# Patient Record
Sex: Female | Born: 1950
Health system: Southern US, Community
[De-identification: ages and names within clinical notes are randomized; demographics above are authoritative.]

## PROBLEM LIST (undated history)

## (undated) DIAGNOSIS — F419 Anxiety disorder, unspecified: Secondary | ICD-10-CM

## (undated) DIAGNOSIS — F32A Depression, unspecified: Secondary | ICD-10-CM

## (undated) DIAGNOSIS — M545 Low back pain, unspecified: Secondary | ICD-10-CM

## (undated) DIAGNOSIS — K219 Gastro-esophageal reflux disease without esophagitis: Secondary | ICD-10-CM

## (undated) DIAGNOSIS — F329 Major depressive disorder, single episode, unspecified: Secondary | ICD-10-CM

## (undated) HISTORY — PX: CARPAL TUNNEL RELEASE: SHX101

## (undated) HISTORY — DX: Depression, unspecified: F32.A

## (undated) HISTORY — DX: Gastro-esophageal reflux disease without esophagitis: K21.9

## (undated) HISTORY — PX: CHOLECYSTECTOMY: SHX55

## (undated) HISTORY — DX: Low back pain: M54.5

## (undated) HISTORY — PX: HERNIA REPAIR: SHX51

## (undated) HISTORY — PX: PLANTAR FASCIA RELEASE: SHX2239

## (undated) HISTORY — PX: ABDOMINAL HYSTERECTOMY: SHX81

## (undated) HISTORY — DX: Low back pain, unspecified: M54.50

## (undated) HISTORY — DX: Major depressive disorder, single episode, unspecified: F32.9

## (undated) HISTORY — DX: Anxiety disorder, unspecified: F41.9

---

## 2001-06-06 ENCOUNTER — Encounter: Payer: Self-pay | Admitting: Neurological Surgery

## 2001-06-06 ENCOUNTER — Ambulatory Visit (HOSPITAL_COMMUNITY): Admission: RE | Admit: 2001-06-06 | Discharge: 2001-06-06 | Payer: Self-pay | Admitting: Neurological Surgery

## 2002-02-27 ENCOUNTER — Encounter: Admission: RE | Admit: 2002-02-27 | Discharge: 2002-02-27 | Payer: Self-pay | Admitting: Neurological Surgery

## 2002-02-27 ENCOUNTER — Encounter: Payer: Self-pay | Admitting: Neurological Surgery

## 2002-07-21 ENCOUNTER — Encounter
Admission: RE | Admit: 2002-07-21 | Discharge: 2002-10-19 | Payer: Self-pay | Admitting: Physical Medicine & Rehabilitation

## 2003-11-03 ENCOUNTER — Ambulatory Visit: Payer: Self-pay | Admitting: Psychiatry

## 2004-02-02 ENCOUNTER — Ambulatory Visit: Payer: Self-pay | Admitting: Psychiatry

## 2004-04-28 ENCOUNTER — Ambulatory Visit: Payer: Self-pay | Admitting: Psychiatry

## 2004-05-31 ENCOUNTER — Ambulatory Visit: Payer: Self-pay | Admitting: Psychiatry

## 2004-08-30 ENCOUNTER — Ambulatory Visit: Payer: Self-pay | Admitting: Psychiatry

## 2004-11-03 ENCOUNTER — Ambulatory Visit: Payer: Self-pay | Admitting: Psychiatry

## 2005-01-26 ENCOUNTER — Ambulatory Visit: Payer: Self-pay | Admitting: Psychiatry

## 2005-04-04 ENCOUNTER — Ambulatory Visit (HOSPITAL_COMMUNITY): Payer: Self-pay | Admitting: Psychiatry

## 2005-06-29 ENCOUNTER — Ambulatory Visit (HOSPITAL_COMMUNITY): Payer: Self-pay | Admitting: Psychiatry

## 2005-10-12 ENCOUNTER — Ambulatory Visit (HOSPITAL_COMMUNITY): Payer: Self-pay | Admitting: Psychiatry

## 2006-01-02 ENCOUNTER — Ambulatory Visit (HOSPITAL_COMMUNITY): Payer: Self-pay | Admitting: Psychiatry

## 2006-01-23 HISTORY — PX: COLONOSCOPY: SHX174

## 2006-03-06 ENCOUNTER — Ambulatory Visit (HOSPITAL_COMMUNITY): Payer: Self-pay | Admitting: Psychiatry

## 2006-08-16 ENCOUNTER — Ambulatory Visit (HOSPITAL_COMMUNITY): Payer: Self-pay | Admitting: Psychiatry

## 2006-10-16 ENCOUNTER — Ambulatory Visit (HOSPITAL_COMMUNITY): Payer: Self-pay | Admitting: Psychiatry

## 2006-10-31 ENCOUNTER — Ambulatory Visit: Payer: Self-pay | Admitting: Gastroenterology

## 2006-11-05 ENCOUNTER — Ambulatory Visit (HOSPITAL_COMMUNITY): Admission: RE | Admit: 2006-11-05 | Discharge: 2006-11-05 | Payer: Self-pay | Admitting: Gastroenterology

## 2006-11-05 ENCOUNTER — Ambulatory Visit: Payer: Self-pay | Admitting: Gastroenterology

## 2006-12-13 ENCOUNTER — Ambulatory Visit (HOSPITAL_COMMUNITY): Payer: Self-pay | Admitting: Psychiatry

## 2007-02-27 ENCOUNTER — Ambulatory Visit: Payer: Self-pay | Admitting: Gastroenterology

## 2007-05-21 ENCOUNTER — Ambulatory Visit (HOSPITAL_COMMUNITY): Payer: Self-pay | Admitting: Psychiatry

## 2007-08-15 ENCOUNTER — Ambulatory Visit (HOSPITAL_COMMUNITY): Payer: Self-pay | Admitting: Psychiatry

## 2008-01-30 ENCOUNTER — Ambulatory Visit (HOSPITAL_COMMUNITY): Payer: Self-pay | Admitting: Psychiatry

## 2008-03-26 ENCOUNTER — Ambulatory Visit (HOSPITAL_COMMUNITY): Payer: Self-pay | Admitting: Psychiatry

## 2008-04-08 ENCOUNTER — Ambulatory Visit (HOSPITAL_COMMUNITY): Admission: RE | Admit: 2008-04-08 | Discharge: 2008-04-08 | Payer: Self-pay | Admitting: Ophthalmology

## 2008-04-22 ENCOUNTER — Ambulatory Visit (HOSPITAL_COMMUNITY): Admission: RE | Admit: 2008-04-22 | Discharge: 2008-04-22 | Payer: Self-pay | Admitting: Ophthalmology

## 2008-07-16 ENCOUNTER — Ambulatory Visit (HOSPITAL_COMMUNITY): Payer: Self-pay | Admitting: Psychiatry

## 2008-12-22 ENCOUNTER — Ambulatory Visit (HOSPITAL_COMMUNITY): Payer: Self-pay | Admitting: Psychiatry

## 2009-05-04 ENCOUNTER — Ambulatory Visit (HOSPITAL_COMMUNITY): Payer: Self-pay | Admitting: Psychiatry

## 2009-07-01 ENCOUNTER — Ambulatory Visit (HOSPITAL_COMMUNITY): Payer: Self-pay | Admitting: Psychiatry

## 2009-11-11 ENCOUNTER — Ambulatory Visit (HOSPITAL_COMMUNITY): Payer: Self-pay | Admitting: Psychiatry

## 2010-01-11 ENCOUNTER — Ambulatory Visit (HOSPITAL_COMMUNITY): Payer: Self-pay | Admitting: Psychiatry

## 2010-03-08 ENCOUNTER — Encounter (HOSPITAL_COMMUNITY): Payer: Self-pay | Admitting: Psychiatry

## 2010-03-08 ENCOUNTER — Encounter (INDEPENDENT_AMBULATORY_CARE_PROVIDER_SITE_OTHER): Payer: Medicaid Other | Admitting: Psychiatry

## 2010-03-08 DIAGNOSIS — F331 Major depressive disorder, recurrent, moderate: Secondary | ICD-10-CM

## 2010-05-05 ENCOUNTER — Encounter (INDEPENDENT_AMBULATORY_CARE_PROVIDER_SITE_OTHER): Payer: Medicaid Other | Admitting: Psychiatry

## 2010-05-05 DIAGNOSIS — F331 Major depressive disorder, recurrent, moderate: Secondary | ICD-10-CM

## 2010-05-05 LAB — BASIC METABOLIC PANEL
BUN: 16 mg/dL (ref 6–23)
CO2: 28 mEq/L (ref 19–32)
Calcium: 9.5 mg/dL (ref 8.4–10.5)
Chloride: 98 mEq/L (ref 96–112)
Creatinine, Ser: 0.7 mg/dL (ref 0.4–1.2)
GFR calc Af Amer: >60 mL/min (ref >60)
GFR calc non Af Amer: >60 mL/min (ref >60)
Glucose, Bld: 108 mg/dL — ABNORMAL HIGH (ref 70–99)
Potassium: 3.6 mEq/L (ref 3.5–5.1)
Sodium: 138 mEq/L (ref 135–145)

## 2010-05-05 LAB — HEMOGLOBIN AND HEMATOCRIT, BLOOD
HCT: 36 % (ref 36.0–46.0)
Hemoglobin: 12.7 g/dL (ref 12.0–15.0)

## 2010-06-07 NOTE — Consult Note (Signed)
[name], [name]                ACCOUNT NO.:  [id number]   MEDICAL RECORD NO.:  [id number]         PATIENT TYPE:  AMB   LOCATION:  DAY                           FACILITY:  [facility]   PHYSICIAN:  [name], M.D.      DATE OF BIRTH:  September 15, 1950   DATE OF CONSULTATION:  10/31/2006  DATE OF DISCHARGE:                                 CONSULTATION   DATE OF CONSULTATION:  October 31, 2006   REASON FOR CONSULTATION:  Hematochezia.   HISTORY OF PRESENT ILLNESS:  [name] is a pleasant, 59 year old lady  who presents today for further evaluation of hematochezia, at the  request of Dr. Clelia Croft.  She had an episode of bright red blood per rectum  which occurred over several occasions in a 24-hour period of time (a  couple of weeks ago).  She noted that the bleeding when she wiped with  tissue.  She denies any constipation.  Her bowel movements are very  regular.  Denies any melena, abdominal pain, rectal pain, nausea or  vomiting.  She has occasional heart burn for which she takes Prilosec.  Denies dysphagia, odynophagia, or unintentional weight loss.  She has  never had a colonoscopy.   CURRENT MEDICATIONS:  1. Valium 10 mg t.i.d.  2. Hyzaar 50 mg daily.  3. Lexapro 20 mg daily.  4. Phenergan 25 mg p.r.n.  5. Prilosec p.r.n.   ALLERGIES:  No known drug allergies.   PAST MEDICAL HISTORY:  1. Hypertension.  2. Degenerative disk disease.  3. Anxiety/depression.   PAST SURGICAL HISTORY:  Cholecystectomy with hernia repair, bilateral  carpal tunnel release, bone spurs removed from both feet, Cesarean  section, hysterectomy and repeat abdominal hernia repair.   FAMILY HISTORY:  Father died in his late 82's due to brain tumor.  A  cousin had colorectal cancer but no first degree relatives.   SOCIAL HISTORY:  She is married, has 2 children and 2 step-children. She  has never smoked cigarettes or used other tobacco products.  Denies any  alcohol use.  She is currently unemployed.   REVIEW OF SYSTEMS:  See HPI for GI and constitutional.  Cardiopulmonary  - denies chest pain, shortness of breath, cough or palpitations.  Genitourinary - denies dysuria, hematuria.   PHYSICAL EXAMINATION:  VITAL SIGNS:  Weight 199, height 5 feet, 5  inches. Temperature 98, blood pressure 118/80, pulse 80.  GENERAL:  Pleasant, obese Caucasian female in no acute distress.  SKIN:  Warm and dry, no jaundice.  HEENT:  Sclerae anicteric. Oropharyngeal mucosa moist and pink, no  lesions, erythema or exudates.  NECK:  No lymphadenopathy, thyromegaly.  CHEST:  Lungs were clear to auscultation.  CARDIAC EXAM:  Reveals a regular rate and rhythm, normal S1, S2, no  murmurs, rubs, or gallops.  ABDOMEN:  Soft, obese but symmetrical, nontender, no organomegaly or  masses, no rebound tenderness or guarding. No abdominal bruits or  guarding, no abdominal bruits or hernias, positive bowel sounds.  EXTREMITIES:  No edema.   IMPRESSION:  [name] is a 60 year old lady with recent episode of  hematochezia.  She has never had a screening colonoscopy. She may have  bleeding from a benign anorectal source however, colorectal polyps,  carcinoma need to be excluded.   PLAN:  Colonoscopy with Dr. Cira Servant in the near future.   I would like to thank Dr. Kirstie Peri for allowing [place] to provide in the  care of this patient.      Tana Coast, P.A.      [name], M.D.  Electronically Signed    LL/MEDQ  D:  10/31/2006  T:  11/01/2006  Job:  [id number]   cc:   Eduard Clos, M.D.  [address]  [place], [place]. [address]

## 2010-06-07 NOTE — Assessment & Plan Note (Signed)
[name], [name]                 CHART#:  [id number]   DATE:  02/27/2007                       DOB:  [birth year]   CHIEF COMPLAINT:  Loose bowel movements.   SUBJECTIVE:  The patient is a 60 year old Caucasian female who presents  for a follow up of loose bowel movements.  We saw her back on October 31, 2006 for hematochezia.  At the time she simply stated that her stools  are very regular ever since she had her gallbladder out around 2000.  She really did not complain of any diarrhea.  She was having a couple-  week history of hematochezia.  She underwent a colonoscopy by Dr. Cira Servant  which revealed sigmoid colon diverticulosis, but otherwise unremarkable.  It was felt that she may have had rectal bleeding from a diverticular  source.  She states that she continues to have loose bowel movements.  She called Dr. Jena Gauss over the weekend and was complaining of the same  and an appointment was made.  He also ordered stool studies which were  all negative including O&P, lactoferrin, C-diff.  A stool culture  revealed moderate yeast but no other organisms.  She says her stools  have not really changed.  Ever since her gallbladder is out she has  episodes of post prandial loose stools.  She may have 3 or 4 a day, but  some days may only have 1 or even none.  She never gets constipated.  Her stools are often very soft or loose.  Denies any blood in the stool  or nocturnal diarrhea.  She has crampy abdominal pain relieved with  defecation.  She tried Bentyl and Levsin with no results.  She avoids  seeds.  She states she consumes a high-fiber diet, but it does not  appear so based on the 24-hour recall.  She has never been on a fiber  supplement, although one was recommended at the time of her colonoscopy.   CURRENT MEDICATIONS:  See updated list.   ALLERGIES:  No known drug allergies.   PHYSICAL EXAMINATION:  Weight 200, stable.  Temp 98, blood pressure  102/70, pulse 56.  GENERAL:  A  pleasant, obese Caucasian female in no acute distress.  SKIN:  Warm and dry, no jaundice.  HEENT:  Sclerae anicteric.  Oropharyngeal mucosa moist and pink.  No  lesions, erythema, or exudate.  ABDOMEN:  Positive bowel sounds, soft, nontender, nondistended.  No  organomegaly or masses, no rebound or guarding.  LOWER EXTREMITIES:  No edema.   IMPRESSION:  The patient is a 60 year old lady who is here for a  followup of loose bowel movement.  She has had loose stools ever since  her gallbladder was removed in 2000.  Nothing has really drastically  changed over the recent months.  She may have post cholecystectomy  diarrhea and/or irritable bowel syndrome.  I do not suspect that  moderate yeast on stool culture to be significant at this time.   PLAN:  1. Will give a trial of Questran 2 g daily, not to be taken within 2      hours of other medications, a 30-day supply, zero refills.  2. High-fiber diet.  Sheet handout to patient.  If she is not      consuming 25 g of fiber daily, she should  take a supplement such as      Benefiber or Fiber Choice.  3. She will call in 2 weeks with a progress report.  If not better at      that time, could consider treating for moderate yeast in the stool.      Further recommendations to follow.       Tana Coast, P.A.  Electronically Signed     R. Roetta Sessions, M.D.  Electronically Signed    LL/MEDQ  D:  02/27/2007  T:  02/28/2007  Job:  [id number]   cc:   Sherryll Burger, MD

## 2010-06-07 NOTE — Op Note (Signed)
[name], [name]                ACCOUNT NO.:  [id number]   MEDICAL RECORD NO.:  [id number]          PATIENT TYPE:  AMB   LOCATION:  DAY                           FACILITY:  [facility]   PHYSICIAN:  Kassie Mends, M.D.      DATE OF BIRTH:  06-29-1950   DATE OF PROCEDURE:  11/05/2006  DATE OF DISCHARGE:                               OPERATIVE REPORT   REFERRING PHYSICIAN:  Kirstie Peri, M.D.   PROCEDURE:  Colonoscopy.   INDICATIONS FOR PROCEDURE:  The patient is a 60 year old female who  presents with rectal bleeding.  She has never had a colonoscopy.   FINDINGS:  1. Sigmoid colon diverticulosis.  2. Otherwise tortuous colon without evidence of polyps, masses,      inflammatory changes, or AVM's.  3. Normal retroflexed view of the rectum.   DIAGNOSIS:  No source for the patient's rectal bleeding was identified.  She likely had a diverticular bleed.   RECOMMENDATIONS:  1. Colonoscopy in 10 years.  2. She should follow a high fiber diet.  She is given a handout on      high fiber diet and diverticulosis.   MEDICATIONS:  1. Demerol 125 mg IV.  2. Versed 9 mg IV.  3. Phenergan 25 mg IV.   DESCRIPTION OF PROCEDURE:  Physical examination was performed and  informed consent was obtained from the patient after explaining the  benefits, risks, and alternatives to the procedure.  The patient was  connected to the monitor and placed in the left lateral position.  Continuous oxygen was provided by nasal cannula and IV medicine  administered through an indwelling cannula.  After administration of  sedation and rectal examination, the patient's  rectum was entered by endoscope and was advanced under direct  visualization to the cecum.  The scope was removed slowly by carefully  examining the color, texture, anatomy, and integrity of the mucosa on  the way out.  The patient was recovered in endoscopy and discharged home  in satisfactory condition.      Kassie Mends, M.D.  Electronically  Signed     SM/MEDQ  D:  11/05/2006  T:  11/05/2006  Job:  [id number]   cc:   Kirstie Peri, MD  Fax: [phone]

## 2010-06-10 NOTE — Op Note (Signed)
[place]  Patient:    [name], [name] Visit Number: [id number] MRN: [id number]          Service Type: DSU Location: [facility] 28[address] [id number] Attending Physician:  Jonne Ply Dictated by:   Stefani Dama, M.D. Proc. Date: 06/06/01 Admit Date:  06/06/2001 Discharge Date: 06/06/2001                             Operative Report  PREOPERATIVE DIAGNOSIS:  Left carpal tunnel syndrome.  POSTOPERATIVE DIAGNOSIS:  Left carpal tunnel syndrome.  OPERATION PERFORMED:  Left carpal tunnel release.  SURGEON:  Stefani Dama, M.D.  ANESTHESIA:  Local plus MAC sedation.  INDICATIONS FOR PROCEDURE:  The patient is a 60 year old individual who has had pain, numbness and tingling into the left arm and hand. She had EMG studies that confirmed significant carpal tunnel syndrome on that left side. She has failed splinting, anti-inflammatories and has had progressive symptoms and was advised regarding carpal tunnel surgery.  DESCRIPTION OF PROCEDURE:  The patient was brought to the operating room supine on the operating table.  The left arm and hand was prepped with DuraPrep while an IV was started on the right side.  She was given IV sedation with Versed and Diprivan and then the region of the wrist was marked from the center crease to a line following the fourth ray on the left hand.  The area was infiltrated with 1% lidocaine with epinephrine 1:100,000.  A vertical incision was made from the crease of the wrist distally towards the palmar arch.  The patient had a significant amount of subcutaneous fatty tissue. This was divided. The transcarpal ligament was identified and this was opened with a 15 blade.  Then using a Penfield 4 as a guard, the length of the transcarpal ligament was opened revealing a flattened and compressed median nerve underneath this.  The ligament was checked to make sure that it was opened as far proximally as possible and distally  into the palmar arcade. Once this was well released, hemostasis in the soft tissues was obtained.  The skin was closed with 3-0 nylon and a vertical mattress sutures being placed. A fluffy dressing was placed over this incision and the wrist was wrapped with an Ace bandage.  The patient was transferred to the recovery room in stable condition. Dictated by:   Stefani Dama, M.D. Attending Physician:  Jonne Ply DD:  06/06/01 TD:  06/10/01 Job: [id number]

## 2010-07-05 ENCOUNTER — Encounter (HOSPITAL_COMMUNITY): Payer: Medicaid Other | Admitting: Psychiatry

## 2010-07-14 ENCOUNTER — Encounter (HOSPITAL_COMMUNITY): Payer: Medicaid Other | Admitting: Psychiatry

## 2010-08-16 ENCOUNTER — Encounter (INDEPENDENT_AMBULATORY_CARE_PROVIDER_SITE_OTHER): Payer: Medicaid Other | Admitting: Psychiatry

## 2010-08-16 DIAGNOSIS — F331 Major depressive disorder, recurrent, moderate: Secondary | ICD-10-CM

## 2010-10-11 ENCOUNTER — Encounter (HOSPITAL_COMMUNITY): Payer: Medicaid Other | Admitting: Psychiatry

## 2010-11-09 ENCOUNTER — Encounter: Payer: Self-pay | Admitting: *Deleted

## 2010-11-10 ENCOUNTER — Encounter (INDEPENDENT_AMBULATORY_CARE_PROVIDER_SITE_OTHER): Payer: Medicaid Other | Admitting: Psychiatry

## 2010-11-10 DIAGNOSIS — F331 Major depressive disorder, recurrent, moderate: Secondary | ICD-10-CM

## 2010-11-11 ENCOUNTER — Telehealth: Payer: Self-pay | Admitting: *Deleted

## 2010-11-11 ENCOUNTER — Encounter: Payer: Self-pay | Admitting: Cardiology

## 2010-11-11 ENCOUNTER — Ambulatory Visit (INDEPENDENT_AMBULATORY_CARE_PROVIDER_SITE_OTHER): Payer: Medicaid Other | Admitting: Cardiology

## 2010-11-11 ENCOUNTER — Other Ambulatory Visit: Payer: Self-pay | Admitting: Cardiology

## 2010-11-11 DIAGNOSIS — Z79899 Other long term (current) drug therapy: Secondary | ICD-10-CM

## 2010-11-11 DIAGNOSIS — F329 Major depressive disorder, single episode, unspecified: Secondary | ICD-10-CM

## 2010-11-11 DIAGNOSIS — I10 Essential (primary) hypertension: Secondary | ICD-10-CM | POA: Insufficient documentation

## 2010-11-11 DIAGNOSIS — R079 Chest pain, unspecified: Secondary | ICD-10-CM | POA: Insufficient documentation

## 2010-11-11 DIAGNOSIS — F341 Dysthymic disorder: Secondary | ICD-10-CM

## 2010-11-11 DIAGNOSIS — R002 Palpitations: Secondary | ICD-10-CM

## 2010-11-11 DIAGNOSIS — Z136 Encounter for screening for cardiovascular disorders: Secondary | ICD-10-CM | POA: Insufficient documentation

## 2010-11-11 NOTE — Assessment & Plan Note (Signed)
Blood pressure is now well controlled. Patient has normal LV function and no evidence of significant LVH either by echocardiogram or EKG.

## 2010-11-11 NOTE — Telephone Encounter (Signed)
Pt has Medicaid only, no precert required. °

## 2010-11-11 NOTE — Progress Notes (Signed)
History of present illness:  The patient is a 60 year old female with no prior history of diabetes mellitus, no tobacco use but exposed to second hand smoking and unknown lipid panels. She is accompanied by her husband who was never checked out because of atypical chest pain. Her husband is also my patient with significant coronary vascular disease. The patient state that she has lost a significant amount of weight and has come down from 205 pounds to 125 pounds. She used to eat a lot of sweets and did no exercise. She does work on lifestyle changes but also was placed on phentermine for approximately a year. She now reports over the last several months left-sided chest pain which occurs both at rest and on walking. In general when it occurs when walking it gets better when she stops walking. There is no radiation of the pain. There is no heavy pressure sensation and more of a sharp pain at the left sternal border. There is no associated diaphoresis or shortness of breath. The patient has had no prior blood testing for dyslipidemia according to her. She also has a history of anxiety and depression and is under treatment.   Allergies, family history and social history: As documented in chart and reviewed. Known drug allergies. Family history of lung cancer brain tumor and heart disease in her sister. Also father had heart disease a later age but he was a smoker. 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  Medications: Documented and reviewed in chart. Medications include diazepam Lexapro and hydrocodone  Past medical history: Reviewed and see problem list below. History of C-section, hysterectomy hernia repair and cholecystectomy   Review of systems: No nausea and vomiting no fever or chills no melena or hematochezia. No dysuria or frequency. No visual changes. No claudication symptoms   Physical examination : Vital signs documented  below General: Well-nourished white female pale appearing but in no distress HEENT: EOMI, PERRLA, normal carotid upstroke no carotid bruit. No thyromegaly Lungs: Clear breath sounds bilaterally without any wheezing Heart: Regular rate and rhythm with normal S1-S2 and no murmur rubs or gallops Abdomen: Soft nontender without rebound or guarding good bowel sounds Extremity exam: No cyanosis clubbing or edema Neurologic: Alert and oriented and grossly nonfocal. Psychiatric: Normal affect Vascular exam: Normal peripheral pulses.  12-lead echocardiogram normal sinus rhythm and no acute changes or prior infarct pattern Limited bedside echocardiogram: Normal LV size and contraction, normal left atrium. Normal right-sided heart structures. Grossly normal valves

## 2010-11-11 NOTE — Assessment & Plan Note (Signed)
Patient's chest pain is atypical but she does have a family history of coronary artery disease and his been exposed for a long time to second hand smoking. We will schedule her for her a stress echocardiogram.

## 2010-11-11 NOTE — Telephone Encounter (Signed)
STRESS ECHO set for 11-15-2010  [facility] Checking percert

## 2010-11-11 NOTE — Assessment & Plan Note (Signed)
We'll obtain a lipid panel.

## 2010-11-11 NOTE — Patient Instructions (Signed)
   Stress Echo  Labs:  CBC, CMET, FLP, & TSH If the results of your test are normal or stable, you will receive a letter.  If they are abnormal, the nurse will contact you by phone. Your physician wants you to follow up in:  3 months.  You will receive a reminder letter in the mail one-two months in advance.  If you don't receive a letter, please call our office to schedule the follow up appointment

## 2010-11-15 DIAGNOSIS — R079 Chest pain, unspecified: Secondary | ICD-10-CM

## 2010-12-31 ENCOUNTER — Other Ambulatory Visit (HOSPITAL_COMMUNITY): Payer: Self-pay

## 2011-02-02 ENCOUNTER — Ambulatory Visit (HOSPITAL_COMMUNITY): Payer: Medicaid Other | Admitting: Psychiatry

## 2011-02-02 ENCOUNTER — Encounter (HOSPITAL_COMMUNITY): Payer: Medicaid Other | Admitting: Psychiatry

## 2011-02-23 ENCOUNTER — Ambulatory Visit: Payer: Medicaid Other | Admitting: Cardiology

## 2011-02-28 ENCOUNTER — Ambulatory Visit (INDEPENDENT_AMBULATORY_CARE_PROVIDER_SITE_OTHER): Payer: Medicaid Other | Admitting: Psychiatry

## 2011-02-28 ENCOUNTER — Encounter (HOSPITAL_COMMUNITY): Payer: Self-pay | Admitting: Psychiatry

## 2011-02-28 VITALS — Wt 124.0 lb

## 2011-02-28 DIAGNOSIS — F411 Generalized anxiety disorder: Secondary | ICD-10-CM

## 2011-02-28 DIAGNOSIS — F419 Anxiety disorder, unspecified: Secondary | ICD-10-CM

## 2011-02-28 DIAGNOSIS — F329 Major depressive disorder, single episode, unspecified: Secondary | ICD-10-CM

## 2011-02-28 MED ORDER — DIAZEPAM 10 MG PO TABS: 10.0000 mg | ORAL_TABLET | Freq: Three times a day (TID) | ORAL | Status: DC

## 2011-02-28 MED ORDER — ESCITALOPRAM OXALATE 20 MG PO TABS: 20.0000 mg | ORAL_TABLET | Freq: Every day | ORAL | Status: DC

## 2011-02-28 NOTE — Progress Notes (Signed)
Patient came for her followup appointment. She is a long history of depression anxiety. She's been seeing in this office since 2004. She's been compliant with her medication Lexapro and Valium. She denies any side effects of medication. She don't use more Valium then it has been prescribed. She denies any drinking or using any drugs. Recently she's been under stress has she is forced to move out to apartment. Patient left her daughter to stay at her place however later find out that place has been infested by bed bug. She is very upset on her daughter has treatment of bed bug costing more than $1500. She could not afford and now she is moving to apartment. She is not talking to her daughter and told her that she should not call her. Overall she's been stable on Lexapro. She is sleeping fine. She denies any crying spells or any agitation. Recently she has no severe anxiety or panic attack. Her primary care Dr. is Dr. Sherryll Burger who has done recently blood work. Her lab including BUN creatinine is within normal limits. She is keeping her weight unchanged. She's been very careful about her diet.  Mental status examination The patient is anxious but cooperative. Her speech is soft clear and coherent. She described her mood is upset and her affect is mood congruent. She denies any active or passive suicidal thoughts or homicidal thoughts. She denies any auditory or visual hallucination. There no psychotic symptoms present. Her thought process logical linear and goal-directed. She's alert and oriented x3. Her insight judgment impulse control is okay.  Assessment Axis I Maj. Depressive disorder, anxiety disorder NOS Axis II chronic back pain Axis III chronic back pain Axis IV moderate  Plan I will continue her Lexapro 20 mg daily and Valium 10 mg 3 times a day. I have explained risks and benefits of medication in detail. I recommended to call if she has any question or concern about the medication. I will see her  again in 3 months. Time spent 15 minutes

## 2011-04-12 ENCOUNTER — Telehealth (HOSPITAL_COMMUNITY): Payer: Self-pay | Admitting: *Deleted

## 2011-04-12 ENCOUNTER — Other Ambulatory Visit (HOSPITAL_COMMUNITY): Payer: Self-pay | Admitting: Psychiatry

## 2011-04-17 NOTE — Telephone Encounter (Signed)
She will require valium 90 tab per month

## 2011-04-25 ENCOUNTER — Ambulatory Visit: Payer: Medicaid Other | Admitting: Cardiology

## 2011-05-02 ENCOUNTER — Other Ambulatory Visit (HOSPITAL_COMMUNITY): Payer: Self-pay | Admitting: Psychiatry

## 2011-05-02 ENCOUNTER — Telehealth (HOSPITAL_COMMUNITY): Payer: Self-pay | Admitting: *Deleted

## 2011-05-02 DIAGNOSIS — F419 Anxiety disorder, unspecified: Secondary | ICD-10-CM

## 2011-05-02 MED ORDER — DIAZEPAM 10 MG PO TABS: 10.0000 mg | ORAL_TABLET | Freq: Three times a day (TID) | ORAL | Status: DC

## 2011-05-02 NOTE — Telephone Encounter (Signed)
Pharmacy called for 90 valium with no refill

## 2011-05-30 ENCOUNTER — Encounter (HOSPITAL_COMMUNITY): Payer: Self-pay | Admitting: Psychiatry

## 2011-05-30 ENCOUNTER — Ambulatory Visit (INDEPENDENT_AMBULATORY_CARE_PROVIDER_SITE_OTHER): Payer: Medicaid Other | Admitting: Psychiatry

## 2011-05-30 VITALS — Wt 125.0 lb

## 2011-05-30 DIAGNOSIS — F329 Major depressive disorder, single episode, unspecified: Secondary | ICD-10-CM

## 2011-05-30 DIAGNOSIS — F419 Anxiety disorder, unspecified: Secondary | ICD-10-CM

## 2011-05-30 DIAGNOSIS — F411 Generalized anxiety disorder: Secondary | ICD-10-CM

## 2011-05-30 MED ORDER — DIAZEPAM 10 MG PO TABS: 10.0000 mg | ORAL_TABLET | Freq: Three times a day (TID) | ORAL | Status: DC

## 2011-05-30 MED ORDER — ESCITALOPRAM OXALATE 20 MG PO TABS: 20.0000 mg | ORAL_TABLET | Freq: Every day | ORAL | Status: DC

## 2011-05-30 NOTE — Progress Notes (Signed)
Chief complaint Medication management and followup.  History of present illness Patient is 61 year old unemployed married female who came for her followup appointment.  She's been compliant with her psychiatric medication.  She continued to endorse significant stress from her husband.  Patient told that she cannot go anywhere by herself.  She likes to visit her 34-year-old granddaughter but cannot go by herself.  Today patient's husband came with her for this appointment.  Overall she likes her current medication.  She denies any agitation anger or mood swings.  She sleeps better with the medication.  There has been no recent panic attack.  She has chronic stress about finances and family issues.  She has any drinking or using any illegal substance.  She does not ask for early refills of her Valium.  Patient is scheduled to see her primary care physician next month.  She start losing her weight and she is relieved by that.  Current psychiatric medication Lexapro 20 mg daily Valium 10 mg 3 times a day  Past psychiatric history She is a long history of depression anxiety. She's been seeing in this office since 2004.  She had tried Klonopin, Xanax, Wellbutrin and Seroquel.  Patient denies any history of suicidal attempt.  Medical history Patient has history of low back pain and acid reflux.  She see Dr. Sherryll Burger and is scheduled to have work next month.  Psychosocial history Patient is living with her husband.  Mental status examination The patient is casually dressed and fairly groomed.  She is nxious but cooperative. Her speech is soft clear and coherent. She described her mood is anxious and her affect is mood congruent. She denies any active or passive suicidal thoughts or homicidal thoughts. She denies any auditory or visual hallucination. There no psychotic symptoms present. Her thought process logical linear and goal-directed. She's alert and oriented x3. Her insight judgment impulse control is  okay.  Assessment Axis I Maj. Depressive disorder, anxiety disorder NOS Axis II Defeered Axis III chronic back pain Axis IV moderate  Plan I will continue her Lexapro 20 mg daily and Valium 10 mg 3 times a day. I have explained risks and benefits of medication in detail. I recommended to call if she has any question or concern about the medication. I will see her again in 3 months.  I recommended to have her blood work results faxed to [place] next month.

## 2011-06-30 ENCOUNTER — Telehealth (HOSPITAL_COMMUNITY): Payer: Self-pay | Admitting: *Deleted

## 2011-07-02 ENCOUNTER — Other Ambulatory Visit (HOSPITAL_COMMUNITY): Payer: Self-pay | Admitting: Psychiatry

## 2011-08-01 ENCOUNTER — Ambulatory Visit (INDEPENDENT_AMBULATORY_CARE_PROVIDER_SITE_OTHER): Payer: Medicaid Other | Admitting: Psychiatry

## 2011-08-01 ENCOUNTER — Encounter (HOSPITAL_COMMUNITY): Payer: Self-pay | Admitting: Psychiatry

## 2011-08-01 VITALS — Wt 126.0 lb

## 2011-08-01 DIAGNOSIS — F411 Generalized anxiety disorder: Secondary | ICD-10-CM

## 2011-08-01 DIAGNOSIS — F329 Major depressive disorder, single episode, unspecified: Secondary | ICD-10-CM

## 2011-08-01 DIAGNOSIS — F419 Anxiety disorder, unspecified: Secondary | ICD-10-CM

## 2011-08-01 MED ORDER — DIAZEPAM 10 MG PO TABS: 10.0000 mg | ORAL_TABLET | Freq: Three times a day (TID) | ORAL | Status: DC

## 2011-08-01 MED ORDER — ESCITALOPRAM OXALATE 20 MG PO TABS: 20.0000 mg | ORAL_TABLET | Freq: Every day | ORAL | Status: DC

## 2011-08-01 NOTE — Progress Notes (Signed)
Chief complaint Medication management and followup.  History of present illness Patient is 61 year old unemployed married female who came for her followup appointment.  She's been compliant with her psychiatric medication.  She continued to endorse family stress.  Patient has tried to make new friends in her neighborhood however her husband do not letter ago by herself for a longer period of time.  She has not seen her granddaughter in past few weeks.  Patient told even today her husband came with her for this appointment.  Patient get sometimes frustrated denies any recent crying spells agitation or severe mood swing.  She sleeping better with the medication.  She's relief that she is able to gain some weight from the past.  She was losing her weight and she was very concerned about it.  Her appetite is also improved from the past.  She has seen recently her primary care physician and reported her blood work was normal.  She denies any new medication prescribed by her primary care physician.  She's not drinking or using any illegal substance.  She's not asking for early refills for her benzodiazepine.  Current psychiatric medication Lexapro 20 mg daily Valium 10 mg 3 times a day  Past psychiatric history She is a long history of depression anxiety. She's been seeing in this office since 2004.  She had tried Klonopin, Xanax, Wellbutrin and Seroquel.  Patient denies any history of suicidal attempt.  Medical history Patient has history of low back pain and acid reflux.  She see Dr. Sherryll Burger and and her last blood work was reported normal.  Psychosocial history Patient is living with her husband.  Mental status examination The patient is casually dressed and fairly groomed.  She is nxious but cooperative. Her speech is soft clear and coherent. She described her mood is anxious and her affect is mood congruent. She denies any active or passive suicidal thoughts or homicidal thoughts. She denies any  auditory or visual hallucination. There no psychotic symptoms present.  There were no flight of idea or loose association.  Her thought process logical linear and goal-directed. She's alert and oriented x3. Her insight judgment impulse control is okay.  Assessment Axis I Maj. Depressive disorder, anxiety disorder NOS Axis II Defeered Axis III chronic back pain Axis IV moderate  Plan I will continue her Lexapro 20 mg daily and Valium 10 mg 3 times a day. I have explained risks and benefits of medication in detail. I recommended to call if she has any question or concern about the medication. I will see her again in 3 months.  I recommended to have her blood work results faxed which was done by her primary care physician.

## 2011-10-03 ENCOUNTER — Ambulatory Visit (HOSPITAL_COMMUNITY): Payer: Self-pay | Admitting: Psychiatry

## 2011-10-05 ENCOUNTER — Encounter (HOSPITAL_COMMUNITY): Payer: Self-pay | Admitting: Psychiatry

## 2011-10-05 ENCOUNTER — Ambulatory Visit (INDEPENDENT_AMBULATORY_CARE_PROVIDER_SITE_OTHER): Payer: Medicaid Other | Admitting: Psychiatry

## 2011-10-05 VITALS — BP 108/67 | HR 60 | Wt 127.0 lb

## 2011-10-05 DIAGNOSIS — F329 Major depressive disorder, single episode, unspecified: Secondary | ICD-10-CM

## 2011-10-05 DIAGNOSIS — Z79899 Other long term (current) drug therapy: Secondary | ICD-10-CM

## 2011-10-05 DIAGNOSIS — F411 Generalized anxiety disorder: Secondary | ICD-10-CM

## 2011-10-05 DIAGNOSIS — F419 Anxiety disorder, unspecified: Secondary | ICD-10-CM

## 2011-10-05 LAB — CBC WITH DIFFERENTIAL/PLATELET
Eosinophils Absolute: 0.1 10*3/uL (ref 0.0–0.7)
Eosinophils Relative: 2 % (ref 0–5)
HCT: 35.4 % — ABNORMAL LOW (ref 36.0–46.0)
Hemoglobin: 12.4 g/dL (ref 12.0–15.0)
Lymphs Abs: 1.3 10*3/uL (ref 0.7–4.0)
MCH: 30.9 pg (ref 26.0–34.0)
MCV: 88.3 fL (ref 78.0–100.0)
Monocytes Relative: 8 % (ref 3–12)
RBC: 4.01 MIL/uL (ref 3.87–5.11)

## 2011-10-05 MED ORDER — ESCITALOPRAM OXALATE 20 MG PO TABS: 20.0000 mg | ORAL_TABLET | Freq: Every day | ORAL | Status: DC

## 2011-10-05 MED ORDER — DIAZEPAM 10 MG PO TABS: 10.0000 mg | ORAL_TABLET | Freq: Three times a day (TID) | ORAL | Status: DC

## 2011-10-05 NOTE — Progress Notes (Signed)
Chief complaint Medication management and followup.  History of present illness Patient is 61 year old unemployed married female who came for her followup appointment.  She's been compliant with her psychiatric medication.  She continued to endorse family stress.  Her husband does not let to meet our visit people in the neighborhood.  Patient has no contact with her children and and family member.  Recently she had minor surgery on her hand been it got infected but none of her family member called and came to visit her.  Patient sometime Frustrated living like this.  Her husband does not give money to buy anything.  Patient believe he is my shadow all the time.  Patient denies any abuse from her husband but feel that he has been with her all the time.  Patient denies any recent agitation anger mood swing.  She started to help her landlord but her husband was not happy about it.  Patient does not want to leave her husband.  But feel some time that she should have some time for herself.  She denies any active or passive suicidal thoughts.  She denies any crying spells.  She denies any agitation anger or any hallucination.  She's taking the medication.  She's not abusing her Valium.  She's not drinking or using any illegal substance.  Current psychiatric medication Lexapro 20 mg daily Valium 10 mg 3 times a day  Review of systems Patient denies any headache, neuropathy, insomnia or anxiety.  Past psychiatric history She is a long history of depression anxiety. She's been seeing in this office since 2004.  She had tried Klonopin, Xanax, Wellbutrin and Seroquel.  Patient denies any history of suicidal attempt.  Medical history Patient has history of low back pain and acid reflux.  She see Dr. Sherryll Burger and and her last blood work was reported normal.  Psychosocial history Patient is living with her husband.  This is her second marriage.  She has 2 children from her first marriage however patient has no  contact with them.  She has one daughter with her current husband but she does not come and visit them.  Mental status examination The patient is casually dressed and fairly groomed.  She is calm and cooperative. Her speech is soft clear and coherent. She described her mood is okay and her affect is mood appropriate. She denies any active or passive suicidal thoughts or homicidal thoughts. She denies any auditory or visual hallucination. There no psychotic symptoms present.  There were no flight of idea or loose association.  Her thought process logical linear and goal-directed. She's alert and oriented x3. Her insight judgment impulse control is okay.  Assessment Axis I Maj. Depressive disorder, anxiety disorder NOS Axis II Defeered Axis III chronic back pain Axis IV moderate  Plan I review her psychosocial stressor, and recommend to see therapist for coping and social skills however patient does not want to see therapist.  I will continue her current psychiatric medication.  I will also get CBC, CMP and hemoglobin A 1C since her has not done in past one year.  I explained risks and benefits of medication especially benzodiazepine tolerance withdrawal and dependency.  I will see her again in 2 months.  I recommend to call us if she is any question or concern if she feels worsening of the symptom.

## 2011-10-06 LAB — COMPREHENSIVE METABOLIC PANEL
CO2: 31 mEq/L (ref 19–32)
Calcium: 9.8 mg/dL (ref 8.4–10.5)
Chloride: 104 mEq/L (ref 96–112)
Creat: 0.74 mg/dL (ref 0.50–1.10)
Glucose, Bld: 91 mg/dL (ref 70–99)
Total Bilirubin: 0.3 mg/dL (ref 0.3–1.2)
Total Protein: 6.7 g/dL (ref 6.0–8.3)

## 2011-10-06 LAB — HEMOGLOBIN A1C
Hgb A1c MFr Bld: 5.1 % (ref <5.7)
Mean Plasma Glucose: 100 mg/dL (ref <117)

## 2011-12-05 ENCOUNTER — Ambulatory Visit (HOSPITAL_COMMUNITY): Payer: Self-pay | Admitting: Psychiatry

## 2011-12-06 ENCOUNTER — Ambulatory Visit (HOSPITAL_COMMUNITY): Payer: Self-pay | Admitting: Psychiatry

## 2011-12-06 ENCOUNTER — Encounter (HOSPITAL_COMMUNITY): Payer: Self-pay | Admitting: Psychiatry

## 2011-12-06 ENCOUNTER — Ambulatory Visit (INDEPENDENT_AMBULATORY_CARE_PROVIDER_SITE_OTHER): Payer: 59 | Admitting: Psychiatry

## 2011-12-06 ENCOUNTER — Telehealth (HOSPITAL_COMMUNITY): Payer: Self-pay | Admitting: *Deleted

## 2011-12-06 VITALS — BP 140/80 | HR 64 | Ht 61.0 in | Wt 127.6 lb

## 2011-12-06 DIAGNOSIS — F419 Anxiety disorder, unspecified: Secondary | ICD-10-CM

## 2011-12-06 DIAGNOSIS — F329 Major depressive disorder, single episode, unspecified: Secondary | ICD-10-CM

## 2011-12-06 DIAGNOSIS — F32A Depression, unspecified: Secondary | ICD-10-CM

## 2011-12-06 DIAGNOSIS — M62830 Muscle spasm of back: Secondary | ICD-10-CM

## 2011-12-06 DIAGNOSIS — F411 Generalized anxiety disorder: Secondary | ICD-10-CM

## 2011-12-06 MED ORDER — GABAPENTIN 100 MG PO CAPS: ORAL_CAPSULE | ORAL | Status: DC

## 2011-12-06 MED ORDER — METHOCARBAMOL 500 MG PO TABS: 500.0000 mg | ORAL_TABLET | Freq: Three times a day (TID) | ORAL | Status: DC

## 2011-12-06 MED ORDER — ESCITALOPRAM OXALATE 20 MG PO TABS: 20.0000 mg | ORAL_TABLET | Freq: Every day | ORAL | Status: AC

## 2011-12-06 MED ORDER — DIAZEPAM 10 MG PO TABS: 10.0000 mg | ORAL_TABLET | Freq: Three times a day (TID) | ORAL | Status: AC | PRN

## 2011-12-06 NOTE — Patient Instructions (Addendum)
Could use "Move Free" or "Osteo bi Flex" for arthritic pain.   The important ingredients are Chondrotin Sulfate and Glucosamine.  Native Wisdom for [employer] by Wyn Quaker Schafe may be an interesting resource for you.  Kudilini Yoga may be helpful for meditation  Yoga may be also helpful for the pain management.  "I am Wishes Fulfilled Meditation" by Marylene Buerger and Lyndal Pulley may be helpful for meditation as well.

## 2011-12-06 NOTE — Progress Notes (Signed)
Chief complaint Medication management and followup.  History of present illness Patient is 61 year old unemployed married female who came for her followup appointment.  He has been compliant with all of her medications.  She walks and lost 80 lbs to help her manage her pain.  Advised her that beholding beauty will help her manage her pain, anxiety, and depression.  She notes no problems with the medications.  She notes that the 3 valium don't seem to help that much.  Discussed tolerance to alcohol and valium.  She seemed to understand that discussion.  She says that she would never touch Neurontin because of all that she has heard about that medication.  She was asked if she would touch anything that could cause disinhibition, depression, or addiction.  She says that she would not, then she was challenged that she is on Valium that does that.  Discussed her spiritual home.  She reads a lot of books on the Rapture and Those left behind series.  I encouraged her to consider some readings that focus more on God's Love.  Mentioned to her "Native Wisdom for white minds".  She may consider that.  She admits that she does not know how to meditate.  Reviewed with her some methods including music, kundilini yoga, and stillness.  Also mentioned beholding beauty as something that will help.  Encouraged her to return to the chiropractor as that had helped in the past.   Current psychiatric medication Lexapro 20 mg daily Valium 10 mg 3 times a day  Review of systems Patient denies any headache, neuropathy, insomnia or anxiety.  Past psychiatric history She is a long history of depression anxiety. She's been seeing in this office since 2004.  She had tried Klonopin, Xanax, Wellbutrin and Seroquel.  Patient denies any history of suicidal attempt.  Medical history Patient has history of low back pain and acid reflux.  She see Dr. Sherryll Burger and and her last blood work was reported normal.  Psychosocial history Patient  is living with her husband.  This is her second marriage.  She has 2 children from her first marriage however patient has no contact with them.  She has one daughter with her current husband but she does not come and visit them.  Mental status examination The patient is casually dressed and fairly groomed.  She is calm and cooperative. Her speech is soft clear and coherent. She described her mood is okay and her affect is mood appropriate. She denies any active or passive suicidal thoughts or homicidal thoughts. She denies any auditory or visual hallucination. There no psychotic symptoms present.  There were no flight of idea or loose association.  Her thought process logical linear and goal-directed. She's alert and oriented x3. Her insight judgment impulse control is okay.  Assessment Axis I Maj. Depressive disorder, anxiety disorder NOS Axis II Defeered Axis III chronic back pain Axis IV moderate  Plan I took her vitals, reviewed her CC, med hx, surg hx, family hs, tobacco hx, medications, effects and side effects, problem list, symptoms and responses to medications and other therapies.  She is agreeable with trying the Neurontin for pain and anxiety management.  Given info in instructions for calming the spirit.  RTC 4 weeks 30 min

## 2012-01-05 ENCOUNTER — Ambulatory Visit (HOSPITAL_COMMUNITY): Payer: Self-pay | Admitting: Psychiatry

## 2012-01-29 ENCOUNTER — Ambulatory Visit (HOSPITAL_COMMUNITY): Payer: Self-pay | Admitting: Psychiatry

## 2016-10-02 ENCOUNTER — Telehealth: Payer: Self-pay | Admitting: Gastroenterology

## 2016-10-02 NOTE — Telephone Encounter (Signed)
Letter mailed to pt.  

## 2016-10-02 NOTE — Telephone Encounter (Signed)
RECALL FOR TCS °

## 2016-11-28 ENCOUNTER — Telehealth: Payer: Self-pay

## 2016-11-28 NOTE — Telephone Encounter (Signed)
Letter reminder for colonoscopy returned. Phone number changed and number listed is not her number.

## 2018-03-19 DIAGNOSIS — Z299 Encounter for prophylactic measures, unspecified: Secondary | ICD-10-CM | POA: Diagnosis not present

## 2018-03-19 DIAGNOSIS — Z6830 Body mass index (BMI) 30.0-30.9, adult: Secondary | ICD-10-CM | POA: Diagnosis not present

## 2018-03-19 DIAGNOSIS — R69 Illness, unspecified: Secondary | ICD-10-CM | POA: Diagnosis not present

## 2018-03-19 DIAGNOSIS — Z789 Other specified health status: Secondary | ICD-10-CM | POA: Diagnosis not present

## 2018-03-19 DIAGNOSIS — E7800 Pure hypercholesterolemia, unspecified: Secondary | ICD-10-CM | POA: Diagnosis not present

## 2018-06-14 DIAGNOSIS — Z299 Encounter for prophylactic measures, unspecified: Secondary | ICD-10-CM | POA: Diagnosis not present

## 2018-06-14 DIAGNOSIS — E7800 Pure hypercholesterolemia, unspecified: Secondary | ICD-10-CM | POA: Diagnosis not present

## 2018-06-14 DIAGNOSIS — Z6830 Body mass index (BMI) 30.0-30.9, adult: Secondary | ICD-10-CM | POA: Diagnosis not present

## 2018-06-14 DIAGNOSIS — Z713 Dietary counseling and surveillance: Secondary | ICD-10-CM | POA: Diagnosis not present

## 2018-06-14 DIAGNOSIS — G43909 Migraine, unspecified, not intractable, without status migrainosus: Secondary | ICD-10-CM | POA: Diagnosis not present

## 2018-08-08 DIAGNOSIS — H43393 Other vitreous opacities, bilateral: Secondary | ICD-10-CM | POA: Diagnosis not present

## 2018-08-21 DIAGNOSIS — Z1339 Encounter for screening examination for other mental health and behavioral disorders: Secondary | ICD-10-CM | POA: Diagnosis not present

## 2018-08-21 DIAGNOSIS — Z0000 Encounter for general adult medical examination without abnormal findings: Secondary | ICD-10-CM | POA: Diagnosis not present

## 2018-08-21 DIAGNOSIS — Z7189 Other specified counseling: Secondary | ICD-10-CM | POA: Diagnosis not present

## 2018-08-21 DIAGNOSIS — Z299 Encounter for prophylactic measures, unspecified: Secondary | ICD-10-CM | POA: Diagnosis not present

## 2018-08-21 DIAGNOSIS — E7800 Pure hypercholesterolemia, unspecified: Secondary | ICD-10-CM | POA: Diagnosis not present

## 2018-08-21 DIAGNOSIS — R69 Illness, unspecified: Secondary | ICD-10-CM | POA: Diagnosis not present

## 2018-08-21 DIAGNOSIS — Z1211 Encounter for screening for malignant neoplasm of colon: Secondary | ICD-10-CM | POA: Diagnosis not present

## 2018-08-21 DIAGNOSIS — Z1331 Encounter for screening for depression: Secondary | ICD-10-CM | POA: Diagnosis not present

## 2018-08-21 DIAGNOSIS — Z6831 Body mass index (BMI) 31.0-31.9, adult: Secondary | ICD-10-CM | POA: Diagnosis not present

## 2018-08-21 DIAGNOSIS — E669 Obesity, unspecified: Secondary | ICD-10-CM | POA: Diagnosis not present

## 2018-08-21 DIAGNOSIS — Z79899 Other long term (current) drug therapy: Secondary | ICD-10-CM | POA: Diagnosis not present

## 2018-09-16 DIAGNOSIS — E669 Obesity, unspecified: Secondary | ICD-10-CM | POA: Diagnosis not present

## 2018-09-16 DIAGNOSIS — Z6831 Body mass index (BMI) 31.0-31.9, adult: Secondary | ICD-10-CM | POA: Diagnosis not present

## 2018-09-16 DIAGNOSIS — Z299 Encounter for prophylactic measures, unspecified: Secondary | ICD-10-CM | POA: Diagnosis not present

## 2018-09-16 DIAGNOSIS — Z789 Other specified health status: Secondary | ICD-10-CM | POA: Diagnosis not present

## 2018-09-16 DIAGNOSIS — E7800 Pure hypercholesterolemia, unspecified: Secondary | ICD-10-CM | POA: Diagnosis not present

## 2018-10-07 DIAGNOSIS — Z713 Dietary counseling and surveillance: Secondary | ICD-10-CM | POA: Diagnosis not present

## 2018-10-07 DIAGNOSIS — Z299 Encounter for prophylactic measures, unspecified: Secondary | ICD-10-CM | POA: Diagnosis not present

## 2018-10-07 DIAGNOSIS — Z789 Other specified health status: Secondary | ICD-10-CM | POA: Diagnosis not present

## 2018-10-07 DIAGNOSIS — Z6827 Body mass index (BMI) 27.0-27.9, adult: Secondary | ICD-10-CM | POA: Diagnosis not present

## 2018-10-07 DIAGNOSIS — J029 Acute pharyngitis, unspecified: Secondary | ICD-10-CM | POA: Diagnosis not present

## 2018-10-07 DIAGNOSIS — E8940 Asymptomatic postprocedural ovarian failure: Secondary | ICD-10-CM | POA: Diagnosis not present

## 2018-11-14 DIAGNOSIS — E7800 Pure hypercholesterolemia, unspecified: Secondary | ICD-10-CM | POA: Diagnosis not present

## 2018-11-22 DIAGNOSIS — M25572 Pain in left ankle and joints of left foot: Secondary | ICD-10-CM | POA: Diagnosis not present

## 2018-11-22 DIAGNOSIS — W010XXA Fall on same level from slipping, tripping and stumbling without subsequent striking against object, initial encounter: Secondary | ICD-10-CM | POA: Diagnosis not present

## 2018-11-22 DIAGNOSIS — W1839XA Other fall on same level, initial encounter: Secondary | ICD-10-CM | POA: Diagnosis not present

## 2018-11-22 DIAGNOSIS — S99912A Unspecified injury of left ankle, initial encounter: Secondary | ICD-10-CM | POA: Diagnosis not present

## 2018-11-22 DIAGNOSIS — S82892A Other fracture of left lower leg, initial encounter for closed fracture: Secondary | ICD-10-CM | POA: Diagnosis not present

## 2018-11-22 DIAGNOSIS — M7989 Other specified soft tissue disorders: Secondary | ICD-10-CM | POA: Diagnosis not present

## 2018-11-26 DIAGNOSIS — S8265XA Nondisplaced fracture of lateral malleolus of left fibula, initial encounter for closed fracture: Secondary | ICD-10-CM | POA: Diagnosis not present

## 2018-12-17 DIAGNOSIS — R69 Illness, unspecified: Secondary | ICD-10-CM | POA: Diagnosis not present

## 2018-12-17 DIAGNOSIS — Z299 Encounter for prophylactic measures, unspecified: Secondary | ICD-10-CM | POA: Diagnosis not present

## 2018-12-17 DIAGNOSIS — E7800 Pure hypercholesterolemia, unspecified: Secondary | ICD-10-CM | POA: Diagnosis not present

## 2018-12-17 DIAGNOSIS — Z6828 Body mass index (BMI) 28.0-28.9, adult: Secondary | ICD-10-CM | POA: Diagnosis not present

## 2018-12-17 DIAGNOSIS — F329 Major depressive disorder, single episode, unspecified: Secondary | ICD-10-CM | POA: Diagnosis not present

## 2019-02-11 DIAGNOSIS — R1032 Left lower quadrant pain: Secondary | ICD-10-CM | POA: Diagnosis not present

## 2019-02-11 DIAGNOSIS — Z6828 Body mass index (BMI) 28.0-28.9, adult: Secondary | ICD-10-CM | POA: Diagnosis not present

## 2019-02-11 DIAGNOSIS — R69 Illness, unspecified: Secondary | ICD-10-CM | POA: Diagnosis not present

## 2019-02-11 DIAGNOSIS — Z789 Other specified health status: Secondary | ICD-10-CM | POA: Diagnosis not present

## 2019-02-11 DIAGNOSIS — R3911 Hesitancy of micturition: Secondary | ICD-10-CM | POA: Diagnosis not present

## 2019-02-11 DIAGNOSIS — Z299 Encounter for prophylactic measures, unspecified: Secondary | ICD-10-CM | POA: Diagnosis not present

## 2019-02-13 DIAGNOSIS — Y838 Other surgical procedures as the cause of abnormal reaction of the patient, or of later complication, without mention of misadventure at the time of the procedure: Secondary | ICD-10-CM | POA: Diagnosis not present

## 2019-02-13 DIAGNOSIS — Z9049 Acquired absence of other specified parts of digestive tract: Secondary | ICD-10-CM | POA: Diagnosis not present

## 2019-02-13 DIAGNOSIS — R1032 Left lower quadrant pain: Secondary | ICD-10-CM | POA: Diagnosis not present

## 2019-02-13 DIAGNOSIS — T85628A Displacement of other specified internal prosthetic devices, implants and grafts, initial encounter: Secondary | ICD-10-CM | POA: Diagnosis not present

## 2019-02-13 DIAGNOSIS — R1030 Lower abdominal pain, unspecified: Secondary | ICD-10-CM | POA: Diagnosis not present

## 2019-02-18 DIAGNOSIS — Z2821 Immunization not carried out because of patient refusal: Secondary | ICD-10-CM | POA: Diagnosis not present

## 2019-02-18 DIAGNOSIS — R69 Illness, unspecified: Secondary | ICD-10-CM | POA: Diagnosis not present

## 2019-02-18 DIAGNOSIS — Z789 Other specified health status: Secondary | ICD-10-CM | POA: Diagnosis not present

## 2019-02-18 DIAGNOSIS — Z299 Encounter for prophylactic measures, unspecified: Secondary | ICD-10-CM | POA: Diagnosis not present

## 2019-02-18 DIAGNOSIS — K831 Obstruction of bile duct: Secondary | ICD-10-CM | POA: Diagnosis not present

## 2019-02-18 DIAGNOSIS — Z6828 Body mass index (BMI) 28.0-28.9, adult: Secondary | ICD-10-CM | POA: Diagnosis not present

## 2019-03-14 DIAGNOSIS — K831 Obstruction of bile duct: Secondary | ICD-10-CM | POA: Diagnosis not present

## 2019-03-14 DIAGNOSIS — R932 Abnormal findings on diagnostic imaging of liver and biliary tract: Secondary | ICD-10-CM | POA: Diagnosis not present

## 2019-03-14 DIAGNOSIS — K838 Other specified diseases of biliary tract: Secondary | ICD-10-CM | POA: Diagnosis not present

## 2019-03-14 DIAGNOSIS — K862 Cyst of pancreas: Secondary | ICD-10-CM | POA: Diagnosis not present

## 2019-03-14 DIAGNOSIS — Z9049 Acquired absence of other specified parts of digestive tract: Secondary | ICD-10-CM | POA: Diagnosis not present

## 2019-03-19 DIAGNOSIS — Z6829 Body mass index (BMI) 29.0-29.9, adult: Secondary | ICD-10-CM | POA: Diagnosis not present

## 2019-03-19 DIAGNOSIS — K862 Cyst of pancreas: Secondary | ICD-10-CM | POA: Diagnosis not present

## 2019-03-19 DIAGNOSIS — R69 Illness, unspecified: Secondary | ICD-10-CM | POA: Diagnosis not present

## 2019-03-19 DIAGNOSIS — Z789 Other specified health status: Secondary | ICD-10-CM | POA: Diagnosis not present

## 2019-03-19 DIAGNOSIS — Z299 Encounter for prophylactic measures, unspecified: Secondary | ICD-10-CM | POA: Diagnosis not present

## 2019-03-19 DIAGNOSIS — E7800 Pure hypercholesterolemia, unspecified: Secondary | ICD-10-CM | POA: Diagnosis not present

## 2019-03-25 ENCOUNTER — Encounter: Payer: Self-pay | Admitting: Internal Medicine

## 2019-04-11 ENCOUNTER — Ambulatory Visit (INDEPENDENT_AMBULATORY_CARE_PROVIDER_SITE_OTHER): Payer: Medicare HMO | Admitting: Gastroenterology

## 2019-04-11 ENCOUNTER — Other Ambulatory Visit: Payer: Self-pay

## 2019-04-11 ENCOUNTER — Encounter: Payer: Self-pay | Admitting: Gastroenterology

## 2019-04-11 DIAGNOSIS — K862 Cyst of pancreas: Secondary | ICD-10-CM | POA: Insufficient documentation

## 2019-04-11 NOTE — Patient Instructions (Signed)
1. We will be in touch with further instructions once we receive records from [facility].

## 2019-04-11 NOTE — Progress Notes (Signed)
Primary Care Physician:  Monico Blitz, MD  Primary Gastroenterologist:  Barney Drain, MD     Chief Complaint  Patient presents with  . pancreatic cyst         HPI:  [name] is a 69 y.o. female here at the request of Dr. Manuella Ghazi for further evaluation of pancreatic cyst.  Patient states she had abdominal pain recently that she felt was coming from her previously repaired abdominal wall hernia.  She underwent imaging at [facility], describes having ultrasound initially.  She reports common bile duct dilation.  Subsequent MRI again with common bile duct dilatation but also with pancreatic cyst.  Patient had cholecystectomy.  Her abdominal pain is now resolved.  She has chronic GERD which is well controlled.  Bowel movements are regular.  No blood in the stool or melena.  No nausea or vomiting.  No unintentional weight loss.  Requested records from [facility].  MRI abdomen without contrast on 03/14/2019 showed prior cholecystectomy, biliary ductal dilatation seen with common bile duct measuring 16 mm.  No choledocholithiasis.  Smooth tapering of distal common bile duct seen, no evidence of stricture.  6 mm mildly complex cystic lesion in the pancreatic body.  Other tiny less than 5 mm cystic foci seen in the pancreatic head.  These likely represent indolent cystic neoplasm such as sidebranch IPMN's.  Recommend 2-year follow-up MRI with and without contrast.  No evidence of pancreatic ductal dilatation.   Current Outpatient Medications  Medication Sig Dispense Refill  . escitalopram (LEXAPRO) 20 MG tablet Take 1 tablet (20 mg total) by mouth daily. 30 tablet 1  . omeprazole (PRILOSEC) 20 MG capsule Take 20 mg by mouth daily.    . rizatriptan (MAXALT) 5 MG tablet Take 5 mg by mouth as needed for migraine. May repeat in 2 hours if needed     No current facility-administered medications for this visit.    Allergies as of 04/11/2019  . (No Known Allergies)    Past Medical History:   Diagnosis Date  . Anxiety   . Depression   . GERD (gastroesophageal reflux disease)   . Low back pain    DDD    Past Surgical History:  Procedure Laterality Date  . ABDOMINAL HYSTERECTOMY    . CARPAL TUNNEL RELEASE    . CESAREAN SECTION    . CHOLECYSTECTOMY    . COLONOSCOPY  2008   Dr. Oneida Alar: Diverticulosis.  Next colonoscopy 10 years  . HERNIA REPAIR    . PLANTAR FASCIA RELEASE      Family History  Problem Relation Age of Onset  . Depression Father   . Prostate cancer Father   . Brain cancer Father 30       glioblastoma  . Lung cancer Mother        nonsmoker  . Dementia Paternal Aunt   . Bone cancer Maternal Aunt   . Colon cancer Neg Hx     Social History   Socioeconomic History  . Marital status: Married    Spouse name: Not on file  . Number of children: Not on file  . Years of education: Not on file  . Highest education level: Not on file  Occupational History  . Not on file  Tobacco Use  . Smoking status: Never Smoker  . Smokeless tobacco: Never Used  Substance and Sexual Activity  . Alcohol use: No  . Drug use: No  . Sexual activity: Not on file  Other Topics Concern  .  Not on file  Social History Narrative  . Not on file   Social Determinants of Health   Financial Resource Strain:   . Difficulty of Paying Living Expenses:   Food Insecurity:   . Worried About Charity fundraiser in the Last Year:   . Arboriculturist in the Last Year:   Transportation Needs:   . Film/video editor (Medical):   Marland Kitchen Lack of Transportation (Non-Medical):   Physical Activity:   . Days of Exercise per Week:   . Minutes of Exercise per Session:   Stress:   . Feeling of Stress :   Social Connections:   . Frequency of Communication with Friends and Family:   . Frequency of Social Gatherings with Friends and Family:   . Attends Religious Services:   . Active Member of Clubs or Organizations:   . Attends Archivist Meetings:   Marland Kitchen Marital Status:    Intimate Partner Violence:   . Fear of Current or Ex-Partner:   . Emotionally Abused:   Marland Kitchen Physically Abused:   . Sexually Abused:       ROS:  General: Negative for anorexia, weight loss, fever, chills, fatigue, weakness. Eyes: Negative for vision changes.  ENT: Negative for hoarseness, difficulty swallowing , nasal congestion. CV: Negative for chest pain, angina, palpitations, dyspnea on exertion, peripheral edema.  Respiratory: Negative for dyspnea at rest, dyspnea on exertion, cough, sputum, wheezing.  GI: See history of present illness. GU:  Negative for dysuria, hematuria, urinary incontinence, urinary frequency, nocturnal urination.  MS: Negative for joint pain, intermittent low back pain.  Derm: Negative for rash or itching.  Neuro: Negative for weakness, abnormal sensation, seizure, frequent headaches, memory loss, confusion.  Psych: Negative for anxiety, depression, suicidal ideation, hallucinations.  Endo: Negative for unusual weight change.  Heme: Negative for bruising or bleeding. Allergy: Negative for rash or hives.    Physical Examination:  BP (!) 142/82   Pulse 81   Temp (!) 96.2 F (35.7 C) (Temporal)   Wt 156 lb 6.4 oz (70.9 kg)   BMI 29.55 kg/m    General: Well-nourished, well-developed in no acute distress.  Head: Normocephalic, atraumatic.   Eyes: Conjunctiva pink, no icterus. Mouth: Oropharyngeal mucosa moist and pink , no lesions erythema or exudate. Neck: Supple without thyromegaly, masses, or lymphadenopathy.  Lungs: Clear to auscultation bilaterally.  Heart: Regular rate and rhythm, no murmurs rubs or gallops.  Abdomen: Bowel sounds are normal, nontender, nondistended, no hepatosplenomegaly or masses, no abdominal bruits or    hernia , no rebound or guarding.   Rectal: Not performed Extremities: No lower extremity edema. No clubbing or deformities.  Neuro: Alert and oriented x 4 , grossly normal neurologically.  Skin: Warm and dry, no rash or  jaundice.   Psych: Alert and cooperative, normal mood and affect.  Labs: Requested  Imaging Studies: No results found.

## 2019-04-17 ENCOUNTER — Encounter: Payer: Self-pay | Admitting: Gastroenterology

## 2019-04-17 NOTE — Assessment & Plan Note (Addendum)
Pleasant 69 year old female presenting for further evaluation of pancreatic cysts noted on recent MRI abdomen without contrast.  MRI was being done to evaluate dilated bile duct seen on ultrasound.  CBD measured 16 mm without evidence of choledocholithiasis.  No evidence of CBD stricture.  She had a 6 mm mildly complex cystic lesion in the pancreatic body along with several tiny 5 mm cystic foci in the pancreatic head, no solid masses.  Per guidelines radiology recommending 2-year follow-up MRI with and without contrast is felt that these lesions may be indolent cystic neoplasm such as sidebranch IPMN's however given initial MRI was without contrast she may need further evaluation initially.  Will discuss with colleague, Dr. Ardis Hughs, to determine if repeat MRI with contrast vs EUS would be appropriate.

## 2019-04-18 NOTE — Progress Notes (Signed)
Cc'ed to pcp °

## 2019-04-22 ENCOUNTER — Telehealth: Payer: Self-pay | Admitting: Gastroenterology

## 2019-04-22 DIAGNOSIS — K869 Disease of pancreas, unspecified: Secondary | ICD-10-CM

## 2019-04-22 DIAGNOSIS — K838 Other specified diseases of biliary tract: Secondary | ICD-10-CM

## 2019-04-22 NOTE — Telephone Encounter (Signed)
Spoke to [place] at [place], she advised since pt had MRI at another facility the radiologist will want MRI/MRCP abd with and without contrast.

## 2019-04-22 NOTE — Telephone Encounter (Signed)
MRI scheduled for 05/22/19 at 9:30am, arrive at 9:15am. NPO 4 hours prior to test. Will need lab prior to test.  Tried to call pt, no answer, LMOVM to inform her of appt. Letter mailed with lab work order.

## 2019-04-22 NOTE — Telephone Encounter (Signed)
PA for MRI submitted via [facility]. Case went to clinical review. Clinical notes uploaded to [place]. Service order: [id number].

## 2019-04-22 NOTE — Addendum Note (Signed)
Addended by: Hassan Rowan on: 04/22/2019 09:58 AM   Modules accepted: Orders

## 2019-04-22 NOTE — Telephone Encounter (Signed)
Please let patient know that I discussed her pancreatic cyst with colleague in [place] (as I told her I would at time of recent OV). He did not recommend EUS (endoscopic u/s) right now but did recommend repeat MRI but with contrast so that the pancreas can be seen adequately. Her last MRI was without contrast.     Please schedule:  MRI abd/MRCP with contrast: Dx dilated CBD and pancreatic lesions seen on noncontrast MRI, rule out solid pancreatic mass.  [facility] clinical: please ask scheduling how to order. I need to rule out solid pancreatic mass, evaluate pancreatic cysts, and evaluate for dilated CBD. Make sure we order correctly for that as this is her second MRI.

## 2019-04-22 NOTE — Telephone Encounter (Signed)
Called lmom to give pt results

## 2019-04-22 NOTE — Telephone Encounter (Signed)
Called verified dob and notified pt that Please let patient know that I discussed her pancreatic cyst with colleague in [place] (as I told her I would at time of recent OV). He did not recommend EUS (endoscopic u/s) right now but did recommend repeat MRI but with contrast so that the pancreas can be seen adequately. Her last MRI was without contrast. To rule out solid pancreatic mass. I notified her that someone from our office, either Tretha Sciara or mindy will reach out to her sometime this week or next week once this is scheduled . Pt stated she understood thanked me for the call

## 2019-04-22 NOTE — Addendum Note (Signed)
Addended by: Hassan Rowan on: 04/22/2019 09:46 AM   Modules accepted: Orders

## 2019-04-28 NOTE — Telephone Encounter (Signed)
Received fax from [facility], MRI denied d/t pt recently had MRI in February. Fax placed on LSL's desk to see if she would like peer-to-peer discussion.

## 2019-04-29 NOTE — Telephone Encounter (Signed)
Please set up peer to peer review for today at 9:30 OR anytime tomorrow.

## 2019-04-29 NOTE — Telephone Encounter (Signed)
Called [facility] Over[facility], spoke to [place] F, peer-to-peer discussion scheduled for 8:15am 04/30/19. Dr. Duaine Dredge will be calling office to speak to LSL.

## 2019-04-30 ENCOUNTER — Telehealth: Payer: Self-pay | Admitting: Gastroenterology

## 2019-04-30 NOTE — Telephone Encounter (Signed)
PA valid 04/30/19-10/27/19.

## 2019-04-30 NOTE — Telephone Encounter (Signed)
MRI approved: Approval number: [id number]

## 2019-04-30 NOTE — Telephone Encounter (Signed)
Opened in error

## 2019-05-12 DIAGNOSIS — Z1231 Encounter for screening mammogram for malignant neoplasm of breast: Secondary | ICD-10-CM | POA: Diagnosis not present

## 2019-05-20 ENCOUNTER — Telehealth: Payer: Self-pay | Admitting: Gastroenterology

## 2019-05-20 DIAGNOSIS — K838 Other specified diseases of biliary tract: Secondary | ICD-10-CM | POA: Diagnosis not present

## 2019-05-20 DIAGNOSIS — K869 Disease of pancreas, unspecified: Secondary | ICD-10-CM | POA: Diagnosis not present

## 2019-05-20 LAB — CREATININE, SERUM: Creat: 0.78 mg/dL (ref 0.50–0.99)

## 2019-05-20 NOTE — Telephone Encounter (Signed)
Pt called to let us know that she was going today to have her labs done. She also said that she was told to go to [facility] to pick up her contrast, but they told her she didn't have to drink contrast that she would be getting an IV. [phone]

## 2019-05-20 NOTE — Telephone Encounter (Signed)
noted 

## 2019-05-22 ENCOUNTER — Ambulatory Visit (HOSPITAL_COMMUNITY): Payer: Medicare HMO

## 2019-05-23 ENCOUNTER — Ambulatory Visit (HOSPITAL_COMMUNITY)
Admission: RE | Admit: 2019-05-23 | Discharge: 2019-05-23 | Disposition: A | Discharge status: Home / Self Care | Payer: Medicare HMO | Source: Ambulatory Visit | Attending: Gastroenterology | Admitting: Gastroenterology

## 2019-05-23 ENCOUNTER — Other Ambulatory Visit: Payer: Self-pay

## 2019-05-23 ENCOUNTER — Other Ambulatory Visit: Payer: Self-pay | Admitting: Gastroenterology

## 2019-05-23 DIAGNOSIS — K838 Other specified diseases of biliary tract: Secondary | ICD-10-CM | POA: Insufficient documentation

## 2019-05-23 DIAGNOSIS — K8689 Other specified diseases of pancreas: Secondary | ICD-10-CM | POA: Diagnosis not present

## 2019-05-23 DIAGNOSIS — K869 Disease of pancreas, unspecified: Secondary | ICD-10-CM

## 2019-05-23 DIAGNOSIS — K862 Cyst of pancreas: Secondary | ICD-10-CM | POA: Diagnosis not present

## 2019-05-23 DIAGNOSIS — R935 Abnormal findings on diagnostic imaging of other abdominal regions, including retroperitoneum: Secondary | ICD-10-CM | POA: Diagnosis not present

## 2019-05-23 DIAGNOSIS — R932 Abnormal findings on diagnostic imaging of liver and biliary tract: Secondary | ICD-10-CM | POA: Diagnosis not present

## 2019-05-23 MED ORDER — GADOBUTROL 1 MMOL/ML IV SOLN
10.0000 mL | Freq: Once | INTRAVENOUS | Status: AC | PRN
  Administered 2019-05-23: 7 mL via INTRAVENOUS

## 2019-06-02 ENCOUNTER — Telehealth: Payer: Self-pay

## 2019-06-02 ENCOUNTER — Telehealth: Payer: Self-pay | Admitting: Gastroenterology

## 2019-06-02 ENCOUNTER — Other Ambulatory Visit: Payer: Self-pay

## 2019-06-02 DIAGNOSIS — K838 Other specified diseases of biliary tract: Secondary | ICD-10-CM

## 2019-06-02 DIAGNOSIS — K862 Cyst of pancreas: Secondary | ICD-10-CM

## 2019-06-02 DIAGNOSIS — K869 Disease of pancreas, unspecified: Secondary | ICD-10-CM

## 2019-06-02 NOTE — Telephone Encounter (Signed)
Yes, thanks for checking

## 2019-06-02 NOTE — Telephone Encounter (Signed)
The first available appt with Dr Ardis Hughs is 07/10/19(you have an ERCP on 5/27 so there is not enough time to add anything else)  and Dr Rush Landmark is 7/12.

## 2019-06-02 NOTE — Telephone Encounter (Signed)
-----   Message from Claudia Banister, MD sent at 06/02/2019  5:26 AM EDT ----- [name],  Not sure I would call her main pancreatic duct dilated (at 1mm) but regardless I think evaluating the ampulla is a very good next step with duodenoscope and EUS.  Can you also get a current set of LFTs as well.   If you give her a heads up, we'll reach out to her about EUS in the coming few weeks.  Thanks    [name], She needs upper EUS, first available with [name] or myself.  Add her to end of my day Thursday May 27th if needed.  Thanks  Wynetta Fines   ----- Message ----- From: [name] Sent: 05/29/2019   1:19 PM EDT To: Claudia Banister, MD, Claudia Menghini, PA-C  Hey there. We discussed this patient recently. At that time her PCP had obtained a non-contrast MRI and was found to have CBD and pancreatic duct dilation and multiple pancreatic cyst lesions. We decided to pursue MRI with contrast to rule out pancreatic mass. See below findings.   She needs look at her ampulla to exclude ampullary mass but Dr. Gala Lopez is wondering if patient would be best served with EGD +/- EUS at the same time if EGD abnormal. What are your thoughts? Thank you in advance for the advice!  [name]  ----- Message ----- From: Claudia Dolin, MD Sent: 05/26/2019  12:27 PM EDT To: Claudia Menghini, PA-C  for sure look at her ampulla not a bad idea.  I just wonder if someone else needs to do that and if negative (or positive) do an EUS.  We can discuss. ----- Message ----- From: [name] Sent: 05/25/2019   5:43 PM EDT To: Claudia Dolin, MD, Claudia Menghini, PA-C  Hepatobiliary: No mass or other parenchymal abnormality identified. Redemonstrated dilatation of the common bile duct to the ampulla, measuring up to 1.4 cm in caliber.  Pancreas: Redemonstrated dilatation of the pancreatic duct to the ampulla, measuring up to 4 mm in caliber. There are redemonstrated, nonenhancing subcentimeter cystic  lesions of the pancreas, the largest in the pancreatic neck measuring 5 mm (series 8, image 57) and additional 2-3 mm lesions in the pancreatic head. No mass, inflammatory changes, or other parenchymal abnormality identified.  IMPRESSION: 1. No evidence of enhancing mass or obstructing lesion of the pancreatic head to explain biliary or or pancreatic ductal dilatation. The ducts are patent to the ampulla. Dilatation of the common bile duct is likely postoperative in the setting of cholecystectomy. A very subtle ampullary mass is difficult to exclude by imaging and could be further assessed by endoscopy if desired.  2. There are redemonstrated, nonenhancing subcentimeter cystic lesions of the pancreas, the largest in the pancreatic neck measuring 5 mm and additional 2-3 mm lesions in the pancreatic head. As on prior examination, recommend follow up pre and post contrast MRI/MRCP or pancreatic protocol CT in 2 years. This recommendation follows ACR consensus guidelines: Management of Incidental Pancreatic Cysts: A White Paper of the ACR Incidental Findings Committee. J Am Coll Radiol [id number]  DO YOU THINK YOU SHOULD DO EGD TO LOOK AT [facility]???

## 2019-06-02 NOTE — Telephone Encounter (Signed)
EUS scheduled for 06/26/19 at 1230 pm at [facility]. COVID scheduled for 06/21/19 at 1125 am.  Instructions mailed and Left message on machine to call back

## 2019-06-02 NOTE — Telephone Encounter (Signed)
It will start at 1130 am is that ok?

## 2019-06-02 NOTE — Telephone Encounter (Signed)
I think I have time on June 3.  Can you check that again?  Thanks

## 2019-06-02 NOTE — Telephone Encounter (Signed)
Tried to call patient at home number and received a weird hang up signal after few rings. Tried to call patient on cell phone number listed and lady said I had the wrong number.   Please let pt know her MRI showed: 1. Dilated common bile duct 1.4cm to ampulla (place where it enters the small bowel), pancreatic duct was mildly dilated at 53mm to the same location (ampulla). Multiple cystic lesions in the pancreas, largest at pancreatic neck 6mm. No obvious mass. Pancreatic cysts will require follow up MR abd/MRCP in 2 years with and without contrast.  2. Discussed with Dr. Gala Romney here, and colleague Dr. Ardis Hughs. They recommend upper endoscopy to look at ampulla to make sure nothing blocking the bile and pancreatic ducts and if need be complete endoscopic ultrasound at the same time. They have already reached out to patient.  3. Dr.Jacob requesting LFTs now. 4. Please NIC for MRI abd/MRCP with and without contrast for pancreatic cysts surveillance in 04/2021.

## 2019-06-03 ENCOUNTER — Other Ambulatory Visit: Payer: Self-pay

## 2019-06-03 DIAGNOSIS — K862 Cyst of pancreas: Secondary | ICD-10-CM

## 2019-06-03 DIAGNOSIS — Z79899 Other long term (current) drug therapy: Secondary | ICD-10-CM

## 2019-06-03 NOTE — Telephone Encounter (Signed)
Left message on machine to call back  

## 2019-06-03 NOTE — Telephone Encounter (Signed)
Spoke with pt. Pt notified of results. Please NIC for MRI abd/MRCP with and without contrast for pancreatic cyst 04/2021. Lfts order placed and released. Pt will have labs drawn at [facility] this week.

## 2019-06-03 NOTE — Telephone Encounter (Signed)
The pt has been advised and will call after receiving the information in the mail.  She has verbalized understanding.

## 2019-06-03 NOTE — Telephone Encounter (Signed)
[place]

## 2019-06-05 DIAGNOSIS — K862 Cyst of pancreas: Secondary | ICD-10-CM | POA: Diagnosis not present

## 2019-06-05 DIAGNOSIS — Z79899 Other long term (current) drug therapy: Secondary | ICD-10-CM | POA: Diagnosis not present

## 2019-06-06 LAB — HEPATIC FUNCTION PANEL
ALT: 12 IU/L (ref 0–32)
AST: 19 IU/L (ref 0–40)
Albumin: 4.3 g/dL (ref 3.8–4.8)
Alkaline Phosphatase: 99 IU/L (ref 39–117)
Bilirubin Total: 0.3 mg/dL (ref 0.0–1.2)
Bilirubin, Direct: 0.09 mg/dL (ref 0.00–0.40)
Total Protein: 6.9 g/dL (ref 6.0–8.5)

## 2019-06-09 NOTE — Telephone Encounter (Signed)
Reminder in epic °

## 2019-06-16 DIAGNOSIS — E669 Obesity, unspecified: Secondary | ICD-10-CM | POA: Diagnosis not present

## 2019-06-16 DIAGNOSIS — Z789 Other specified health status: Secondary | ICD-10-CM | POA: Diagnosis not present

## 2019-06-16 DIAGNOSIS — K862 Cyst of pancreas: Secondary | ICD-10-CM | POA: Diagnosis not present

## 2019-06-16 DIAGNOSIS — Z299 Encounter for prophylactic measures, unspecified: Secondary | ICD-10-CM | POA: Diagnosis not present

## 2019-06-16 DIAGNOSIS — Z6829 Body mass index (BMI) 29.0-29.9, adult: Secondary | ICD-10-CM | POA: Diagnosis not present

## 2019-06-16 DIAGNOSIS — R69 Illness, unspecified: Secondary | ICD-10-CM | POA: Diagnosis not present

## 2019-06-17 ENCOUNTER — Encounter: Payer: Self-pay | Admitting: Gastroenterology

## 2019-06-17 ENCOUNTER — Telehealth: Payer: Self-pay | Admitting: Gastroenterology

## 2019-06-17 NOTE — Telephone Encounter (Signed)
The pt has been advised that her COVID test is at the old [facility] hospital.  The pt had no further questions.

## 2019-06-17 NOTE — Telephone Encounter (Signed)
The pt has been advised that she will need to have COVID testing on Saturday per hospital policy.

## 2019-06-21 ENCOUNTER — Other Ambulatory Visit (HOSPITAL_COMMUNITY): Payer: Medicare HMO

## 2019-06-24 ENCOUNTER — Other Ambulatory Visit (HOSPITAL_COMMUNITY)
Admission: RE | Admit: 2019-06-24 | Discharge: 2019-06-24 | Disposition: A | Discharge status: Home / Self Care | Payer: Medicare HMO | Source: Ambulatory Visit | Attending: Gastroenterology | Admitting: Gastroenterology

## 2019-06-24 DIAGNOSIS — Z20822 Contact with and (suspected) exposure to covid-19: Secondary | ICD-10-CM | POA: Diagnosis not present

## 2019-06-24 DIAGNOSIS — Z01812 Encounter for preprocedural laboratory examination: Secondary | ICD-10-CM | POA: Diagnosis not present

## 2019-06-24 LAB — SARS CORONAVIRUS 2 (TAT 6-24 HRS): SARS Coronavirus 2: NEGATIVE

## 2019-06-25 ENCOUNTER — Other Ambulatory Visit (HOSPITAL_COMMUNITY): Payer: Medicare HMO

## 2019-06-25 ENCOUNTER — Telehealth: Payer: Self-pay

## 2019-06-25 NOTE — Telephone Encounter (Signed)
Called pt to move hospital procedure 06/26/19 up to start at 8:45am. Pt would need to arrive there at 7:15am. Pt will call and check with her transportation to see if she can come at 7:15am in the morning.

## 2019-06-26 ENCOUNTER — Ambulatory Visit (HOSPITAL_COMMUNITY): Payer: Medicare HMO | Admitting: Registered Nurse

## 2019-06-26 ENCOUNTER — Other Ambulatory Visit: Payer: Self-pay

## 2019-06-26 ENCOUNTER — Encounter (HOSPITAL_COMMUNITY)
Admission: RE | Disposition: A | Discharge status: Home / Self Care | Payer: Self-pay | Source: Home / Self Care | Attending: Gastroenterology

## 2019-06-26 ENCOUNTER — Ambulatory Visit (HOSPITAL_COMMUNITY)
Admission: RE | Admit: 2019-06-26 | Discharge: 2019-06-26 | Disposition: A | Discharge status: Home / Self Care | Payer: Medicare HMO | Attending: Gastroenterology | Admitting: Gastroenterology

## 2019-06-26 ENCOUNTER — Encounter (HOSPITAL_COMMUNITY): Payer: Self-pay | Admitting: Gastroenterology

## 2019-06-26 DIAGNOSIS — K862 Cyst of pancreas: Secondary | ICD-10-CM | POA: Insufficient documentation

## 2019-06-26 DIAGNOSIS — Z9049 Acquired absence of other specified parts of digestive tract: Secondary | ICD-10-CM | POA: Insufficient documentation

## 2019-06-26 DIAGNOSIS — R69 Illness, unspecified: Secondary | ICD-10-CM | POA: Diagnosis not present

## 2019-06-26 DIAGNOSIS — Z818 Family history of other mental and behavioral disorders: Secondary | ICD-10-CM | POA: Insufficient documentation

## 2019-06-26 DIAGNOSIS — K219 Gastro-esophageal reflux disease without esophagitis: Secondary | ICD-10-CM | POA: Insufficient documentation

## 2019-06-26 DIAGNOSIS — Z8042 Family history of malignant neoplasm of prostate: Secondary | ICD-10-CM | POA: Insufficient documentation

## 2019-06-26 DIAGNOSIS — Z90710 Acquired absence of both cervix and uterus: Secondary | ICD-10-CM | POA: Diagnosis not present

## 2019-06-26 DIAGNOSIS — I10 Essential (primary) hypertension: Secondary | ICD-10-CM | POA: Diagnosis not present

## 2019-06-26 DIAGNOSIS — F419 Anxiety disorder, unspecified: Secondary | ICD-10-CM | POA: Insufficient documentation

## 2019-06-26 DIAGNOSIS — K869 Disease of pancreas, unspecified: Secondary | ICD-10-CM

## 2019-06-26 DIAGNOSIS — Z820 Family history of epilepsy and other diseases of the nervous system: Secondary | ICD-10-CM | POA: Insufficient documentation

## 2019-06-26 DIAGNOSIS — K838 Other specified diseases of biliary tract: Secondary | ICD-10-CM | POA: Diagnosis not present

## 2019-06-26 DIAGNOSIS — F329 Major depressive disorder, single episode, unspecified: Secondary | ICD-10-CM | POA: Diagnosis not present

## 2019-06-26 DIAGNOSIS — Z808 Family history of malignant neoplasm of other organs or systems: Secondary | ICD-10-CM | POA: Insufficient documentation

## 2019-06-26 DIAGNOSIS — Z801 Family history of malignant neoplasm of trachea, bronchus and lung: Secondary | ICD-10-CM | POA: Insufficient documentation

## 2019-06-26 DIAGNOSIS — K449 Diaphragmatic hernia without obstruction or gangrene: Secondary | ICD-10-CM | POA: Diagnosis not present

## 2019-06-26 DIAGNOSIS — M545 Low back pain: Secondary | ICD-10-CM | POA: Insufficient documentation

## 2019-06-26 HISTORY — PX: EUS: SHX5427

## 2019-06-26 HISTORY — PX: ESOPHAGOGASTRODUODENOSCOPY: SHX5428

## 2019-06-26 SURGERY — UPPER ENDOSCOPIC ULTRASOUND (EUS) RADIAL
Anesthesia: Monitor Anesthesia Care

## 2019-06-26 MED ORDER — SODIUM CHLORIDE 0.9 % IV SOLN: INTRAVENOUS | Status: DC

## 2019-06-26 MED ORDER — LACTATED RINGERS IV SOLN: INTRAVENOUS | Status: DC | PRN

## 2019-06-26 MED ORDER — LIDOCAINE HCL (CARDIAC) PF 100 MG/5ML IV SOSY
PREFILLED_SYRINGE | INTRAVENOUS | Status: DC | PRN
  Administered 2019-06-26 (×2): 50 mg via INTRATRACHEAL

## 2019-06-26 MED ORDER — LACTATED RINGERS IV SOLN: INTRAVENOUS | Status: DC

## 2019-06-26 MED ORDER — PROPOFOL 500 MG/50ML IV EMUL
INTRAVENOUS | Status: DC | PRN
  Administered 2019-06-26: 135 ug/kg/min via INTRAVENOUS

## 2019-06-26 MED ORDER — PROPOFOL 500 MG/50ML IV EMUL
INTRAVENOUS | Status: AC
  Filled 2019-06-26: qty 100

## 2019-06-26 MED ORDER — ESMOLOL HCL 100 MG/10ML IV SOLN
INTRAVENOUS | Status: DC | PRN
Start: 2019-06-26 — End: 2019-06-26
  Administered 2019-06-26: 20 mg via INTRAVENOUS
  Administered 2019-06-26 (×2): 15 mg via INTRAVENOUS

## 2019-06-26 MED ORDER — PROPOFOL 1000 MG/100ML IV EMUL
INTRAVENOUS | Status: AC
  Filled 2019-06-26: qty 100

## 2019-06-26 MED ORDER — PROPOFOL 500 MG/50ML IV EMUL
INTRAVENOUS | Status: DC | PRN
  Administered 2019-06-26 (×2): 20 mg via INTRAVENOUS
  Administered 2019-06-26: 10 mg via INTRAVENOUS
  Administered 2019-06-26 (×2): 20 mg via INTRAVENOUS

## 2019-06-26 NOTE — Anesthesia Preprocedure Evaluation (Signed)
Anesthesia Evaluation  Patient identified by MRN, date of birth, ID band Patient awake    Reviewed: Allergy & Precautions, NPO status , Patient's Chart, lab work & pertinent test results  Airway Mallampati: II  TM Distance: >3 FB Neck ROM: Full    Dental no notable dental hx.    Pulmonary neg pulmonary ROS,    Pulmonary exam normal breath sounds clear to auscultation       Cardiovascular hypertension, Normal cardiovascular exam Rhythm:Regular Rate:Normal     Neuro/Psych Anxiety negative neurological ROS     GI/Hepatic negative GI ROS, Neg liver ROS,   Endo/Other  negative endocrine ROS  Renal/GU negative Renal ROS  negative genitourinary   Musculoskeletal negative musculoskeletal ROS (+)   Abdominal   Peds negative pediatric ROS (+)  Hematology negative hematology ROS (+)   Anesthesia Other Findings   Reproductive/Obstetrics negative OB ROS                             Anesthesia Physical Anesthesia Plan  ASA: II  Anesthesia Plan: MAC   Post-op Pain Management:    Induction: Intravenous  PONV Risk Score and Plan: 2 and Treatment may vary due to age or medical condition  Airway Management Planned: Simple Face Mask  Additional Equipment:   Intra-op Plan:   Post-operative Plan:   Informed Consent: I have reviewed the patients History and Physical, chart, labs and discussed the procedure including the risks, benefits and alternatives for the proposed anesthesia with the patient or authorized representative who has indicated his/her understanding and acceptance.     Dental advisory given  Plan Discussed with: CRNA and Surgeon  Anesthesia Plan Comments:         Anesthesia Quick Evaluation

## 2019-06-26 NOTE — H&P (Signed)
HPI: This is a woman with incidental dilated bile duct, +/- dilated main PD and several cysts in her pancreas  Normal liver tests  MRI 04/2019 1. No evidence of enhancing mass or obstructing lesion of the pancreatic head to explain biliary or or pancreatic ductal dilatation. The ducts are patent to the ampulla. Dilatation of the common bile duct is likely postoperative in the setting of cholecystectomy. A very subtle ampullary mass is difficult to exclude by imaging and could be further assessed by endoscopy if desired.  2. There are redemonstrated, nonenhancing subcentimeter cystic lesions of the pancreas, the largest in the pancreatic neck measuring 5 mm and additional 2-3 mm lesions in the pancreatic head. As on prior examination, recommend follow up pre and post contrast MRI/MRCP or pancreatic protocol CT in 2 years. This recommendation follows ACR consensus guidelines: Management of Incidental Pancreatic Cysts: A White Paper of the ACR Incidental Findings Committee. [place] [id number].  ROS: complete GI ROS as described in HPI, all other review negative.  Constitutional:  No unintentional weight loss   Past Medical History:  Diagnosis Date  . Anxiety   . Depression   . GERD (gastroesophageal reflux disease)   . Low back pain    DDD    Past Surgical History:  Procedure Laterality Date  . ABDOMINAL HYSTERECTOMY    . CARPAL TUNNEL RELEASE    . CESAREAN SECTION    . CHOLECYSTECTOMY    . COLONOSCOPY  2008   Dr. Oneida Alar: Diverticulosis.  Next colonoscopy 10 years  . HERNIA REPAIR    . PLANTAR FASCIA RELEASE      Current Facility-Administered Medications  Medication Dose Route Frequency Provider Last Rate Last Admin  . 0.9 %  sodium chloride infusion   Intravenous Continuous [name], MD        Allergies as of 06/02/2019  . (No Known Allergies)    Family History  Problem Relation Age of Onset  . Depression Father   . Prostate cancer  Father   . Brain cancer Father 55       glioblastoma  . Lung cancer Mother        nonsmoker  . Dementia Paternal Aunt   . Bone cancer Maternal Aunt   . Colon cancer Neg Hx     Social History   Socioeconomic History  . Marital status: Married    Spouse name: Not on file  . Number of children: Not on file  . Years of education: Not on file  . Highest education level: Not on file  Occupational History  . Not on file  Tobacco Use  . Smoking status: Never Smoker  . Smokeless tobacco: Never Used  Substance and Sexual Activity  . Alcohol use: No  . Drug use: No  . Sexual activity: Not on file  Other Topics Concern  . Not on file  Social History Narrative  . Not on file   Social Determinants of Health   Financial Resource Strain:   . Difficulty of Paying Living Expenses:   Food Insecurity:   . Worried About Charity fundraiser in the Last Year:   . Arboriculturist in the Last Year:   Transportation Needs:   . Film/video editor (Medical):   Marland Kitchen Lack of Transportation (Non-Medical):   Physical Activity:   . Days of Exercise per Week:   . Minutes of Exercise per Session:   Stress:   . Feeling of Stress :  Social Connections:   . Frequency of Communication with Friends and Family:   . Frequency of Social Gatherings with Friends and Family:   . Attends Religious Services:   . Active Member of Clubs or Organizations:   . Attends Archivist Meetings:   Marland Kitchen Marital Status:   Intimate Partner Violence:   . Fear of Current or Ex-Partner:   . Emotionally Abused:   Marland Kitchen Physically Abused:   . Sexually Abused:      Physical Exam: Ht 5\' 3"  (1.6 m)   Wt 71.7 kg   BMI 27.99 kg/m  Constitutional: generally well-appearing Psychiatric: alert and oriented x3 Abdomen: soft, nontender, nondistended, no obvious ascites, no peritoneal signs, normal bowel sounds No peripheral edema noted in lower extremities  Assessment and plan: 69 y.o. female with abnormal pancreas,  cbd  For upper eus today.  Please see the "Patient Instructions" section for addition details about the plan.  Owens Loffler, MD [facility] 06/26/2019, 10:59 AM

## 2019-06-26 NOTE — Telephone Encounter (Signed)
Pt never called back.

## 2019-06-26 NOTE — Op Note (Signed)
[facility] Patient Name: [name] Procedure Date: 06/26/2019 MRN: [id number] Attending MD: [name] , MD Date of Birth: [birth year] CSN: [id number] Age: 69 Admit Type: Outpatient Procedure:                Upper EUS Indications:              Incidentally noted dilated CBD, +/- dilated main                            pancreatic duct, several very small cysts in                            pancreas. Lap chole around 2000, normal liver                            tests, no weight loss. Providers:                [name], MD, Josie Dixon, RN, Cherylynn Ridges, Technician, Enrigue Catena, CRNA Referring MD:             Neil Crouch, PA; Milton Ferguson, MD Medicines:                Monitored Anesthesia Care Complications:            No immediate complications. Estimated blood loss:                            None. Estimated Blood Loss:     Estimated blood loss: none. Procedure:                Pre-Anesthesia Assessment:                           - Prior to the procedure, a History and Physical                            was performed, and patient medications and                            allergies were reviewed. The patient's tolerance of                            previous anesthesia was also reviewed. The risks                            and benefits of the procedure and the sedation                            options and risks were discussed with the patient.                            All questions were answered, and informed consent  was obtained. Prior Anticoagulants: The patient has                            taken no previous anticoagulant or antiplatelet                            agents. ASA Grade Assessment: II - A patient with                            mild systemic disease. After reviewing the risks                            and benefits, the patient was deemed in   satisfactory condition to undergo the procedure.                           After obtaining informed consent, the endoscope was                            passed under direct vision. Throughout the                            procedure, the patient's blood pressure, pulse, and                            oxygen saturations were monitored continuously. The                            was introduced through the mouth, and advanced to                            the second part of duodenum. The [id number]                            [id number]) [place] was introduced                            through the mouth, and advanced to the second part                            of duodenum. The upper EUS was accomplished without                            difficulty. The patient tolerated the procedure                            well. Scope In: Scope Out: Findings:      ENDOSCOPIC FINDING (limited views with duodenoscope and radial EUS       scope): :      The examined esophagus was endoscopically normal.      The entire examined stomach was endoscopically normal.      The examined duodenum was endoscopically normal (ncluding very good       views of normal major papilla with duodenoscope).  ENDOSONOGRAPHIC FINDING: :      1. CBD/CHD was diffusely dilated (CBD 86mm), tapered smoothly into the       head of the pancreas. No stones, no masses within the bile duct.      1. Pancreatic parenchyma contained two small cysts (neck, body). The       largest was 13mm in the neck. Neither had associated solid masses or       mural nodules. The parenchyma was otherwise normal.      3. Main pancreatic duct was normal, non-dilated.      4. No peripancreatic adenopathy.      5. Gallbladder surgically absent.      6. Limited views of liver, spleen, portal and splenic vessels were all       normal. Impression:               - Normal UGI tract, including good views of normal                             major papilla.                           - Dilated extrahepatic bile duct is probably an                            exagerated physiologic response to gallbladder                            removal many years ago.                           - Two very small, innocent appearing pancreatic                            cysts. Moderate Sedation:      Not Applicable - Patient had care per Anesthesia. Recommendation:           - Discharge patient to home.                           - Follow with [facility], should plan on                            repeat pancreas imaging with MR in 1 year and                            periodic LFTs. Procedure Code(s):        --- Professional ---                           [phone], Esophagogastroduodenoscopy, flexible,                            transoral; with endoscopic ultrasound examination                            limited to the esophagus, stomach or duodenum, and  adjacent structures Diagnosis Code(s):        --- Professional ---                           K83.8, Other specified diseases of biliary tract CPT copyright 2019 American Medical Association. All rights reserved. The codes documented in this report are preliminary and upon coder review may  be revised to meet current compliance requirements. [name], MD 06/26/2019 12:20:13 PM This report has been signed electronically. Number of Addenda: 0

## 2019-06-26 NOTE — Transfer of Care (Signed)
Immediate Anesthesia Transfer of Care Note  Patient: [name]  Procedure(s) Performed: UPPER ENDOSCOPIC ULTRASOUND (EUS) RADIAL (N/A ) ESOPHAGOGASTRODUODENOSCOPY (EGD) (N/A )  Patient Location: Endoscopy Unit  Anesthesia Type:MAC  Level of Consciousness: awake, drowsy, patient cooperative and responds to stimulation  Airway & Oxygen Therapy: Patient Spontanous Breathing and Patient connected to face mask oxygen  Post-op Assessment: Report given to RN and Post -op Vital signs reviewed and stable  Post vital signs: Reviewed and stable  Last Vitals:  Vitals Value Taken Time  BP 120/44 06/26/19 1206  Temp    Pulse 59 06/26/19 1208  Resp 18 06/26/19 1208  SpO2 99 % 06/26/19 1208  Vitals shown include unvalidated device data.  Last Pain:  Vitals:   06/26/19 1109  TempSrc: Oral  PainSc: 0-No pain         Complications: No apparent anesthesia complications

## 2019-06-26 NOTE — Discharge Instructions (Signed)

## 2019-06-26 NOTE — Anesthesia Postprocedure Evaluation (Signed)
Anesthesia Post Note  Patient: CAMIKA MARSICO  Procedure(s) Performed: UPPER ENDOSCOPIC ULTRASOUND (EUS) RADIAL (N/A ) ESOPHAGOGASTRODUODENOSCOPY (EGD) (N/A )     Patient location during evaluation: PACU Anesthesia Type: MAC Level of consciousness: awake and alert Pain management: pain level controlled Vital Signs Assessment: post-procedure vital signs reviewed and stable Respiratory status: spontaneous breathing, nonlabored ventilation, respiratory function stable and patient connected to nasal cannula oxygen Cardiovascular status: stable and blood pressure returned to baseline Postop Assessment: no apparent nausea or vomiting Anesthetic complications: no    Last Vitals:  Vitals:   06/26/19 1220 06/26/19 1230  BP: (!) 126/59 140/68  Pulse: 68 (!) 58  Resp: 10 15  Temp:    SpO2: 96% 97%    Last Pain:  Vitals:   06/26/19 1230  TempSrc:   PainSc: 0-No pain                 Mariateresa Batra S

## 2019-06-27 ENCOUNTER — Encounter: Payer: Self-pay | Admitting: *Deleted

## 2019-06-27 ENCOUNTER — Telehealth: Payer: Self-pay | Admitting: *Deleted

## 2019-06-27 NOTE — Telephone Encounter (Signed)
fowarding to AM and SS

## 2019-06-27 NOTE — Telephone Encounter (Signed)
Reminder in epic °

## 2019-06-27 NOTE — Telephone Encounter (Signed)
-----   Message from Daneil Dolin, MD sent at 06/26/2019 12:48 PM EDT ----- Linna Hoff,  thank you very much.  We will arrange the recommended follow-up.  [name] ----- Message ----- From: [name], MD Sent: 06/26/2019  12:21 PM EDT To: Daneil Dolin, MD, Mahala Menghini, PA-C  [name],  Just completed her EUS. See full report in Epic.   - Normal UGI tract, including good views of normal major papilla. - Dilated extrahepatic bile duct is probably an exagerated physiologic response to gallbladder removal many years ago. - Two very small, innocent appearing pancreatic cysts.     She should have repeat MR of the pancreas in 1 year to follow the cysts. Would probably repeat LFTs in 3 months as well.   Thanks  DJ

## 2019-06-30 ENCOUNTER — Other Ambulatory Visit: Payer: Self-pay

## 2019-06-30 DIAGNOSIS — Z79899 Other long term (current) drug therapy: Secondary | ICD-10-CM

## 2019-06-30 DIAGNOSIS — K862 Cyst of pancreas: Secondary | ICD-10-CM

## 2019-06-30 NOTE — Telephone Encounter (Signed)
Noted. Lab order placed for 09/2019.

## 2019-09-03 ENCOUNTER — Other Ambulatory Visit: Payer: Self-pay

## 2019-09-03 DIAGNOSIS — K862 Cyst of pancreas: Secondary | ICD-10-CM

## 2019-09-03 DIAGNOSIS — Z79899 Other long term (current) drug therapy: Secondary | ICD-10-CM

## 2019-09-10 NOTE — Progress Notes (Signed)
Per last phone note, pt is having labs completed 09/2019.

## 2019-10-14 DIAGNOSIS — Z0000 Encounter for general adult medical examination without abnormal findings: Secondary | ICD-10-CM | POA: Diagnosis not present

## 2019-10-14 DIAGNOSIS — Z1331 Encounter for screening for depression: Secondary | ICD-10-CM | POA: Diagnosis not present

## 2019-10-14 DIAGNOSIS — E7800 Pure hypercholesterolemia, unspecified: Secondary | ICD-10-CM | POA: Diagnosis not present

## 2019-10-14 DIAGNOSIS — E559 Vitamin D deficiency, unspecified: Secondary | ICD-10-CM | POA: Diagnosis not present

## 2019-10-14 DIAGNOSIS — Z1211 Encounter for screening for malignant neoplasm of colon: Secondary | ICD-10-CM | POA: Diagnosis not present

## 2019-10-14 DIAGNOSIS — R5383 Other fatigue: Secondary | ICD-10-CM | POA: Diagnosis not present

## 2019-10-14 DIAGNOSIS — Z1339 Encounter for screening examination for other mental health and behavioral disorders: Secondary | ICD-10-CM | POA: Diagnosis not present

## 2019-10-14 DIAGNOSIS — Z79899 Other long term (current) drug therapy: Secondary | ICD-10-CM | POA: Diagnosis not present

## 2019-10-14 DIAGNOSIS — Z6829 Body mass index (BMI) 29.0-29.9, adult: Secondary | ICD-10-CM | POA: Diagnosis not present

## 2019-10-14 DIAGNOSIS — Z299 Encounter for prophylactic measures, unspecified: Secondary | ICD-10-CM | POA: Diagnosis not present

## 2019-10-14 DIAGNOSIS — Z7189 Other specified counseling: Secondary | ICD-10-CM | POA: Diagnosis not present

## 2020-01-12 DIAGNOSIS — R69 Illness, unspecified: Secondary | ICD-10-CM | POA: Diagnosis not present

## 2020-01-12 DIAGNOSIS — Z6829 Body mass index (BMI) 29.0-29.9, adult: Secondary | ICD-10-CM | POA: Diagnosis not present

## 2020-01-12 DIAGNOSIS — Z23 Encounter for immunization: Secondary | ICD-10-CM | POA: Diagnosis not present

## 2020-01-12 DIAGNOSIS — L989 Disorder of the skin and subcutaneous tissue, unspecified: Secondary | ICD-10-CM | POA: Diagnosis not present

## 2020-01-12 DIAGNOSIS — M545 Low back pain, unspecified: Secondary | ICD-10-CM | POA: Diagnosis not present

## 2020-01-22 DIAGNOSIS — Z299 Encounter for prophylactic measures, unspecified: Secondary | ICD-10-CM | POA: Diagnosis not present

## 2020-01-22 DIAGNOSIS — L821 Other seborrheic keratosis: Secondary | ICD-10-CM | POA: Diagnosis not present

## 2020-01-22 DIAGNOSIS — Z6829 Body mass index (BMI) 29.0-29.9, adult: Secondary | ICD-10-CM | POA: Diagnosis not present

## 2020-01-22 DIAGNOSIS — D485 Neoplasm of uncertain behavior of skin: Secondary | ICD-10-CM | POA: Diagnosis not present

## 2020-01-22 DIAGNOSIS — L820 Inflamed seborrheic keratosis: Secondary | ICD-10-CM | POA: Diagnosis not present

## 2020-04-12 DIAGNOSIS — E669 Obesity, unspecified: Secondary | ICD-10-CM | POA: Diagnosis not present

## 2020-04-12 DIAGNOSIS — Z789 Other specified health status: Secondary | ICD-10-CM | POA: Diagnosis not present

## 2020-04-12 DIAGNOSIS — Z6830 Body mass index (BMI) 30.0-30.9, adult: Secondary | ICD-10-CM | POA: Diagnosis not present

## 2020-04-12 DIAGNOSIS — R69 Illness, unspecified: Secondary | ICD-10-CM | POA: Diagnosis not present

## 2020-04-12 DIAGNOSIS — Z299 Encounter for prophylactic measures, unspecified: Secondary | ICD-10-CM | POA: Diagnosis not present

## 2020-07-09 ENCOUNTER — Telehealth: Payer: Self-pay | Admitting: Internal Medicine

## 2020-07-09 NOTE — Telephone Encounter (Signed)
Recall for mri pancreas 

## 2020-07-09 NOTE — Telephone Encounter (Signed)
Letter mailed

## 2020-07-13 DIAGNOSIS — Z713 Dietary counseling and surveillance: Secondary | ICD-10-CM | POA: Diagnosis not present

## 2020-07-13 DIAGNOSIS — Z299 Encounter for prophylactic measures, unspecified: Secondary | ICD-10-CM | POA: Diagnosis not present

## 2020-07-13 DIAGNOSIS — Z6830 Body mass index (BMI) 30.0-30.9, adult: Secondary | ICD-10-CM | POA: Diagnosis not present

## 2020-07-13 DIAGNOSIS — R69 Illness, unspecified: Secondary | ICD-10-CM | POA: Diagnosis not present

## 2020-07-19 ENCOUNTER — Telehealth: Payer: Self-pay | Admitting: Internal Medicine

## 2020-07-19 DIAGNOSIS — K862 Cyst of pancreas: Secondary | ICD-10-CM

## 2020-07-19 NOTE — Telephone Encounter (Signed)
Pt received letter to call. [phone]

## 2020-07-19 NOTE — Telephone Encounter (Signed)
PA for MRI/MRCP abd w/wo contrast submitted via [employer]. Case approved. PA# [id number], valid 07/19/20-01/15/21.

## 2020-07-19 NOTE — Telephone Encounter (Signed)
MRI/MRCP abd w/wo contrast scheduled for 08/03/20 at 9:00am, arrive at 8:30am. NPO 4 hours prior to test.  Called and informed pt of MRI appt. Letter mailed.

## 2020-08-03 ENCOUNTER — Other Ambulatory Visit: Payer: Self-pay

## 2020-08-03 ENCOUNTER — Ambulatory Visit (HOSPITAL_COMMUNITY)
Admission: RE | Admit: 2020-08-03 | Discharge: 2020-08-03 | Disposition: A | Discharge status: Home / Self Care | Payer: Medicare HMO | Source: Ambulatory Visit | Attending: Gastroenterology | Admitting: Gastroenterology

## 2020-08-03 ENCOUNTER — Other Ambulatory Visit: Payer: Self-pay | Admitting: Gastroenterology

## 2020-08-03 DIAGNOSIS — K862 Cyst of pancreas: Secondary | ICD-10-CM

## 2020-08-03 DIAGNOSIS — K838 Other specified diseases of biliary tract: Secondary | ICD-10-CM | POA: Diagnosis not present

## 2020-08-03 DIAGNOSIS — K449 Diaphragmatic hernia without obstruction or gangrene: Secondary | ICD-10-CM | POA: Diagnosis not present

## 2020-08-03 DIAGNOSIS — K863 Pseudocyst of pancreas: Secondary | ICD-10-CM | POA: Diagnosis not present

## 2020-08-03 DIAGNOSIS — K8689 Other specified diseases of pancreas: Secondary | ICD-10-CM | POA: Diagnosis not present

## 2020-08-03 MED ORDER — GADOBUTROL 1 MMOL/ML IV SOLN
7.0000 mL | Freq: Once | INTRAVENOUS | Status: AC | PRN
  Administered 2020-08-03: 7 mL via INTRAVENOUS

## 2020-08-13 NOTE — Progress Notes (Signed)
On recall, cc'ed to pcp

## 2020-10-15 DIAGNOSIS — Z7189 Other specified counseling: Secondary | ICD-10-CM | POA: Diagnosis not present

## 2020-10-15 DIAGNOSIS — Z299 Encounter for prophylactic measures, unspecified: Secondary | ICD-10-CM | POA: Diagnosis not present

## 2020-10-15 DIAGNOSIS — Z1339 Encounter for screening examination for other mental health and behavioral disorders: Secondary | ICD-10-CM | POA: Diagnosis not present

## 2020-10-15 DIAGNOSIS — R5383 Other fatigue: Secondary | ICD-10-CM | POA: Diagnosis not present

## 2020-10-15 DIAGNOSIS — Z6831 Body mass index (BMI) 31.0-31.9, adult: Secondary | ICD-10-CM | POA: Diagnosis not present

## 2020-10-15 DIAGNOSIS — Z0000 Encounter for general adult medical examination without abnormal findings: Secondary | ICD-10-CM | POA: Diagnosis not present

## 2020-10-15 DIAGNOSIS — Z1331 Encounter for screening for depression: Secondary | ICD-10-CM | POA: Diagnosis not present

## 2020-10-15 DIAGNOSIS — Z79899 Other long term (current) drug therapy: Secondary | ICD-10-CM | POA: Diagnosis not present

## 2020-10-15 DIAGNOSIS — Z23 Encounter for immunization: Secondary | ICD-10-CM | POA: Diagnosis not present

## 2020-10-15 DIAGNOSIS — E7800 Pure hypercholesterolemia, unspecified: Secondary | ICD-10-CM | POA: Diagnosis not present

## 2020-10-15 DIAGNOSIS — E559 Vitamin D deficiency, unspecified: Secondary | ICD-10-CM | POA: Diagnosis not present

## 2020-11-23 ENCOUNTER — Other Ambulatory Visit: Payer: Self-pay | Admitting: Internal Medicine

## 2020-11-23 DIAGNOSIS — Z139 Encounter for screening, unspecified: Secondary | ICD-10-CM

## 2020-12-09 DIAGNOSIS — Z299 Encounter for prophylactic measures, unspecified: Secondary | ICD-10-CM | POA: Diagnosis not present

## 2020-12-09 DIAGNOSIS — Z6831 Body mass index (BMI) 31.0-31.9, adult: Secondary | ICD-10-CM | POA: Diagnosis not present

## 2020-12-09 DIAGNOSIS — G43909 Migraine, unspecified, not intractable, without status migrainosus: Secondary | ICD-10-CM | POA: Diagnosis not present

## 2020-12-09 DIAGNOSIS — I10 Essential (primary) hypertension: Secondary | ICD-10-CM | POA: Diagnosis not present

## 2020-12-09 DIAGNOSIS — R69 Illness, unspecified: Secondary | ICD-10-CM | POA: Diagnosis not present

## 2021-01-21 DIAGNOSIS — I10 Essential (primary) hypertension: Secondary | ICD-10-CM | POA: Diagnosis not present

## 2021-01-27 DIAGNOSIS — Z6831 Body mass index (BMI) 31.0-31.9, adult: Secondary | ICD-10-CM | POA: Diagnosis not present

## 2021-01-27 DIAGNOSIS — Z789 Other specified health status: Secondary | ICD-10-CM | POA: Diagnosis not present

## 2021-01-27 DIAGNOSIS — I700 Atherosclerosis of aorta: Secondary | ICD-10-CM | POA: Diagnosis not present

## 2021-01-27 DIAGNOSIS — Z299 Encounter for prophylactic measures, unspecified: Secondary | ICD-10-CM | POA: Diagnosis not present

## 2021-01-27 DIAGNOSIS — I10 Essential (primary) hypertension: Secondary | ICD-10-CM | POA: Diagnosis not present

## 2021-01-27 DIAGNOSIS — R69 Illness, unspecified: Secondary | ICD-10-CM | POA: Diagnosis not present

## 2021-02-20 DIAGNOSIS — I10 Essential (primary) hypertension: Secondary | ICD-10-CM | POA: Diagnosis not present

## 2021-03-22 DIAGNOSIS — I10 Essential (primary) hypertension: Secondary | ICD-10-CM | POA: Diagnosis not present

## 2021-03-31 DIAGNOSIS — H43392 Other vitreous opacities, left eye: Secondary | ICD-10-CM | POA: Diagnosis not present

## 2021-03-31 DIAGNOSIS — H524 Presbyopia: Secondary | ICD-10-CM | POA: Diagnosis not present

## 2021-03-31 DIAGNOSIS — Z0100 Encounter for examination of eyes and vision without abnormal findings: Secondary | ICD-10-CM | POA: Diagnosis not present

## 2021-04-21 DIAGNOSIS — I10 Essential (primary) hypertension: Secondary | ICD-10-CM | POA: Diagnosis not present

## 2021-05-02 DIAGNOSIS — I10 Essential (primary) hypertension: Secondary | ICD-10-CM | POA: Diagnosis not present

## 2021-05-02 DIAGNOSIS — R69 Illness, unspecified: Secondary | ICD-10-CM | POA: Diagnosis not present

## 2021-05-02 DIAGNOSIS — Z299 Encounter for prophylactic measures, unspecified: Secondary | ICD-10-CM | POA: Diagnosis not present

## 2021-05-02 DIAGNOSIS — F32A Depression, unspecified: Secondary | ICD-10-CM | POA: Diagnosis not present

## 2021-05-02 DIAGNOSIS — Z6830 Body mass index (BMI) 30.0-30.9, adult: Secondary | ICD-10-CM | POA: Diagnosis not present

## 2021-05-06 DIAGNOSIS — Z1212 Encounter for screening for malignant neoplasm of rectum: Secondary | ICD-10-CM | POA: Diagnosis not present

## 2021-05-06 DIAGNOSIS — Z1211 Encounter for screening for malignant neoplasm of colon: Secondary | ICD-10-CM | POA: Diagnosis not present

## 2021-05-22 DIAGNOSIS — I10 Essential (primary) hypertension: Secondary | ICD-10-CM | POA: Diagnosis not present

## 2021-06-17 DIAGNOSIS — K529 Noninfective gastroenteritis and colitis, unspecified: Secondary | ICD-10-CM | POA: Diagnosis not present

## 2021-06-17 DIAGNOSIS — I10 Essential (primary) hypertension: Secondary | ICD-10-CM | POA: Diagnosis not present

## 2021-06-17 DIAGNOSIS — Z299 Encounter for prophylactic measures, unspecified: Secondary | ICD-10-CM | POA: Diagnosis not present

## 2021-06-21 DIAGNOSIS — I10 Essential (primary) hypertension: Secondary | ICD-10-CM | POA: Diagnosis not present

## 2021-06-24 DIAGNOSIS — I10 Essential (primary) hypertension: Secondary | ICD-10-CM | POA: Diagnosis not present

## 2021-06-24 DIAGNOSIS — K529 Noninfective gastroenteritis and colitis, unspecified: Secondary | ICD-10-CM | POA: Diagnosis not present

## 2021-06-24 DIAGNOSIS — Z299 Encounter for prophylactic measures, unspecified: Secondary | ICD-10-CM | POA: Diagnosis not present

## 2021-07-18 DIAGNOSIS — Z299 Encounter for prophylactic measures, unspecified: Secondary | ICD-10-CM | POA: Diagnosis not present

## 2021-07-18 DIAGNOSIS — I10 Essential (primary) hypertension: Secondary | ICD-10-CM | POA: Diagnosis not present

## 2021-07-18 DIAGNOSIS — K2970 Gastritis, unspecified, without bleeding: Secondary | ICD-10-CM | POA: Diagnosis not present

## 2021-07-21 DIAGNOSIS — I10 Essential (primary) hypertension: Secondary | ICD-10-CM | POA: Diagnosis not present

## 2021-08-15 DIAGNOSIS — Z299 Encounter for prophylactic measures, unspecified: Secondary | ICD-10-CM | POA: Diagnosis not present

## 2021-08-15 DIAGNOSIS — Z79899 Other long term (current) drug therapy: Secondary | ICD-10-CM | POA: Diagnosis not present

## 2021-08-15 DIAGNOSIS — R5383 Other fatigue: Secondary | ICD-10-CM | POA: Diagnosis not present

## 2021-08-15 DIAGNOSIS — Z789 Other specified health status: Secondary | ICD-10-CM | POA: Diagnosis not present

## 2021-08-15 DIAGNOSIS — Z6829 Body mass index (BMI) 29.0-29.9, adult: Secondary | ICD-10-CM | POA: Diagnosis not present

## 2021-08-15 DIAGNOSIS — Z0000 Encounter for general adult medical examination without abnormal findings: Secondary | ICD-10-CM | POA: Diagnosis not present

## 2021-08-15 DIAGNOSIS — E7800 Pure hypercholesterolemia, unspecified: Secondary | ICD-10-CM | POA: Diagnosis not present

## 2021-08-15 DIAGNOSIS — I10 Essential (primary) hypertension: Secondary | ICD-10-CM | POA: Diagnosis not present

## 2021-08-22 DIAGNOSIS — I10 Essential (primary) hypertension: Secondary | ICD-10-CM | POA: Diagnosis not present

## 2021-08-29 DIAGNOSIS — K047 Periapical abscess without sinus: Secondary | ICD-10-CM | POA: Diagnosis not present

## 2021-08-29 DIAGNOSIS — K0889 Other specified disorders of teeth and supporting structures: Secondary | ICD-10-CM | POA: Diagnosis not present

## 2021-08-29 DIAGNOSIS — R69 Illness, unspecified: Secondary | ICD-10-CM | POA: Diagnosis not present

## 2021-08-29 DIAGNOSIS — K029 Dental caries, unspecified: Secondary | ICD-10-CM | POA: Diagnosis not present

## 2021-08-29 DIAGNOSIS — I10 Essential (primary) hypertension: Secondary | ICD-10-CM | POA: Diagnosis not present

## 2021-09-12 DIAGNOSIS — Z713 Dietary counseling and surveillance: Secondary | ICD-10-CM | POA: Diagnosis not present

## 2021-09-12 DIAGNOSIS — Z299 Encounter for prophylactic measures, unspecified: Secondary | ICD-10-CM | POA: Diagnosis not present

## 2021-09-12 DIAGNOSIS — K1379 Other lesions of oral mucosa: Secondary | ICD-10-CM | POA: Diagnosis not present

## 2021-09-12 DIAGNOSIS — I10 Essential (primary) hypertension: Secondary | ICD-10-CM | POA: Diagnosis not present

## 2021-09-12 DIAGNOSIS — Z6828 Body mass index (BMI) 28.0-28.9, adult: Secondary | ICD-10-CM | POA: Diagnosis not present

## 2021-09-21 DIAGNOSIS — I10 Essential (primary) hypertension: Secondary | ICD-10-CM | POA: Diagnosis not present

## 2021-10-19 DIAGNOSIS — Z7189 Other specified counseling: Secondary | ICD-10-CM | POA: Diagnosis not present

## 2021-10-19 DIAGNOSIS — J302 Other seasonal allergic rhinitis: Secondary | ICD-10-CM | POA: Diagnosis not present

## 2021-10-19 DIAGNOSIS — I10 Essential (primary) hypertension: Secondary | ICD-10-CM | POA: Diagnosis not present

## 2021-10-19 DIAGNOSIS — Z299 Encounter for prophylactic measures, unspecified: Secondary | ICD-10-CM | POA: Diagnosis not present

## 2021-10-19 DIAGNOSIS — Z1331 Encounter for screening for depression: Secondary | ICD-10-CM | POA: Diagnosis not present

## 2021-10-19 DIAGNOSIS — R5383 Other fatigue: Secondary | ICD-10-CM | POA: Diagnosis not present

## 2021-10-19 DIAGNOSIS — Z1339 Encounter for screening examination for other mental health and behavioral disorders: Secondary | ICD-10-CM | POA: Diagnosis not present

## 2021-10-19 DIAGNOSIS — Z0000 Encounter for general adult medical examination without abnormal findings: Secondary | ICD-10-CM | POA: Diagnosis not present

## 2021-10-19 DIAGNOSIS — E7800 Pure hypercholesterolemia, unspecified: Secondary | ICD-10-CM | POA: Diagnosis not present

## 2021-10-19 DIAGNOSIS — Z6828 Body mass index (BMI) 28.0-28.9, adult: Secondary | ICD-10-CM | POA: Diagnosis not present

## 2021-10-19 DIAGNOSIS — Z79899 Other long term (current) drug therapy: Secondary | ICD-10-CM | POA: Diagnosis not present

## 2021-10-21 DIAGNOSIS — I10 Essential (primary) hypertension: Secondary | ICD-10-CM | POA: Diagnosis not present

## 2021-11-11 DIAGNOSIS — E7800 Pure hypercholesterolemia, unspecified: Secondary | ICD-10-CM | POA: Diagnosis not present

## 2021-11-11 DIAGNOSIS — I10 Essential (primary) hypertension: Secondary | ICD-10-CM | POA: Diagnosis not present

## 2021-11-11 DIAGNOSIS — Z299 Encounter for prophylactic measures, unspecified: Secondary | ICD-10-CM | POA: Diagnosis not present

## 2021-11-11 DIAGNOSIS — Z23 Encounter for immunization: Secondary | ICD-10-CM | POA: Diagnosis not present

## 2021-11-11 DIAGNOSIS — F324 Major depressive disorder, single episode, in partial remission: Secondary | ICD-10-CM | POA: Diagnosis not present

## 2021-11-11 DIAGNOSIS — Z6828 Body mass index (BMI) 28.0-28.9, adult: Secondary | ICD-10-CM | POA: Diagnosis not present

## 2021-11-21 DIAGNOSIS — I10 Essential (primary) hypertension: Secondary | ICD-10-CM | POA: Diagnosis not present

## 2021-12-21 DIAGNOSIS — I10 Essential (primary) hypertension: Secondary | ICD-10-CM | POA: Diagnosis not present

## 2022-01-20 DIAGNOSIS — I10 Essential (primary) hypertension: Secondary | ICD-10-CM | POA: Diagnosis not present

## 2022-02-13 DIAGNOSIS — I10 Essential (primary) hypertension: Secondary | ICD-10-CM | POA: Diagnosis not present

## 2022-02-13 DIAGNOSIS — F1320 Sedative, hypnotic or anxiolytic dependence, uncomplicated: Secondary | ICD-10-CM | POA: Diagnosis not present

## 2022-02-13 DIAGNOSIS — I700 Atherosclerosis of aorta: Secondary | ICD-10-CM | POA: Diagnosis not present

## 2022-02-13 DIAGNOSIS — F324 Major depressive disorder, single episode, in partial remission: Secondary | ICD-10-CM | POA: Diagnosis not present

## 2022-02-13 DIAGNOSIS — Z299 Encounter for prophylactic measures, unspecified: Secondary | ICD-10-CM | POA: Diagnosis not present

## 2022-02-20 DIAGNOSIS — I10 Essential (primary) hypertension: Secondary | ICD-10-CM | POA: Diagnosis not present

## 2022-02-22 IMAGING — MR MR ABDOMEN WO/W CM MRCP
19 of 20 series · 45 of 48 positions shown · IV contrast (gadavist)
Comparison: 05/23/2019

CLINICAL DATA: Follow-up of cystic pancreatic lesions

EXAM:
MRI ABDOMEN WITHOUT AND WITH CONTRAST (INCLUDING MRCP)
TECHNIQUE: Multiplanar multisequence MR imaging of the abdomen was performed
both before and after the administration of intravenous contrast.
Heavily T2-weighted images of the biliary and pancreatic ducts were
obtained, and three-dimensional MRCP images were rendered by post
processing.
CONTRAST:  7mL GADAVIST GADOBUTROL 1 MMOL/ML IV SOLN

[Series 4: ax haste · axial · 6.0mm · 1.19mm/px · z∈[-77,+132]mm · 2 of 30 slices shown]
[im 1/30]
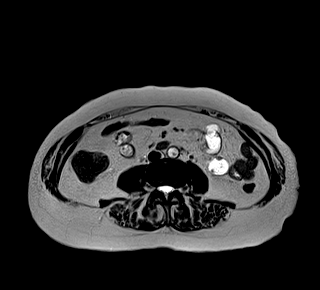
[im 30/30]
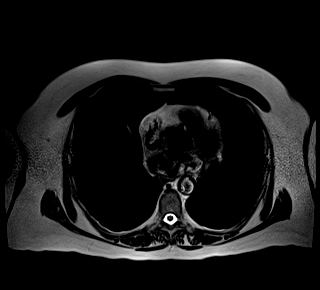

[Series 5: cor haste · coronal · 6.0mm · 1.25mm/px · 2 of 26 slices shown]
[im 1/26]
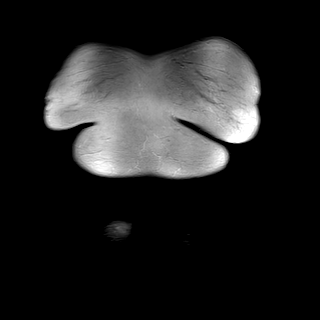
[im 26/26]
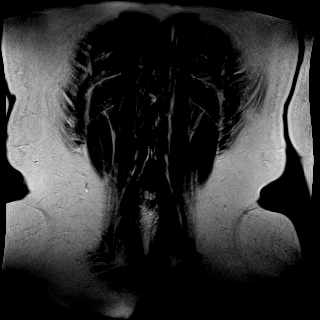

[Series 6: T2 fat-sat · axial · 6.0mm · 1.19mm/px · 1 of 30 slices shown]
[im 1/30]
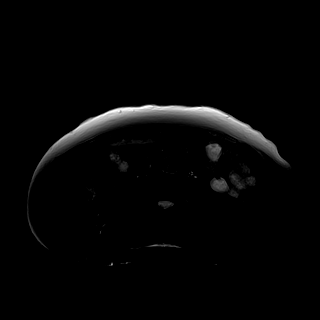

[Series 7: DWI · axial · 6.0mm · 1.42mm/px · 1 of 30 slices shown (1 of 4)]
[im 1/30]
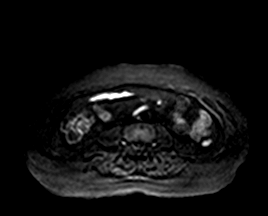

[Series 7: DWI · axial · 6.0mm · 1.42mm/px · 1 of 30 slices shown (2 of 4)]
[im 1/30]
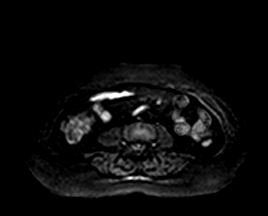

[Series 7: DWI · axial · 6.0mm · 1.42mm/px · 1 of 30 slices shown (3 of 4)]
[im 1/30]
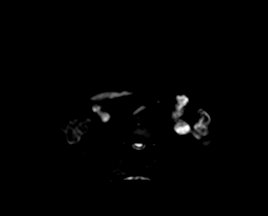

[Series 8: DWI · axial · 6.0mm · 1.42mm/px · 1 of 30 slices shown (4 of 4)]
[im 1/30]
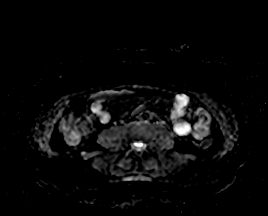

[Series 9: ax in and · axial · 3.0mm · 1.19mm/px · z∈[-79,+134]mm · 3 of 72 slices shown (1 of 2)]
[im 1/72]
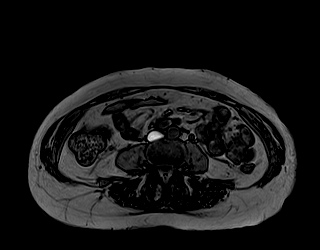
[im 36/72]
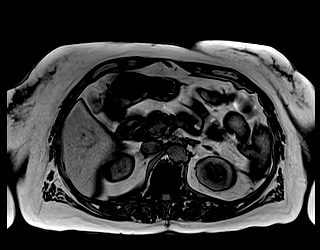
[im 72/72]
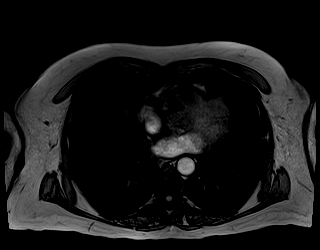

[Series 10: ax in and · axial · 3.0mm · 1.19mm/px · z∈[-79,+134]mm · 3 of 72 slices shown (2 of 2)]
[im 1/72]
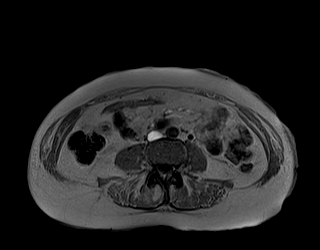
[im 36/72]
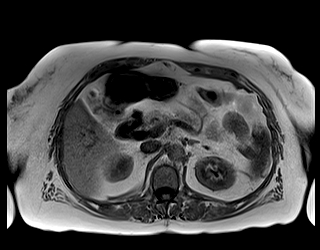
[im 72/72]
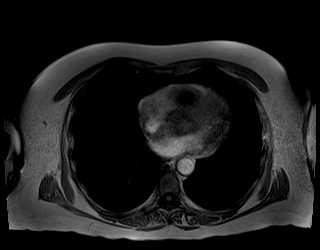

[Series 15: T1 dynamic · axial · non-contrast · 3.0mm · 1.19mm/px · z∈[-69,+144]mm · 3 of 72 slices shown (1 of 4)]
[im 1/72]
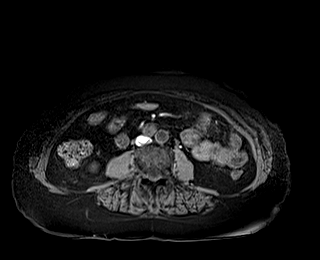
[im 36/72]
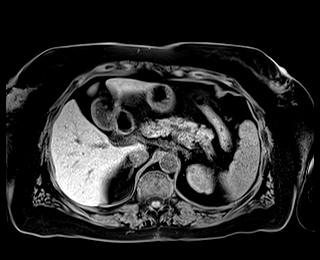
[im 72/72]
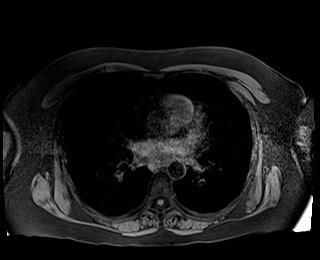

[Series 16: T1 dynamic post-contrast · axial · 3.0mm · 1.19mm/px · z∈[-69,+144]mm · 3 of 72 slices shown (1 of 6)]
[im 1/72]
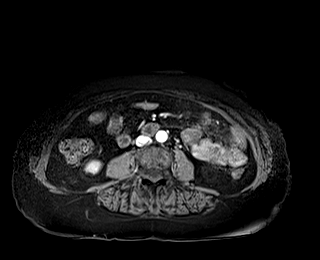
[im 36/72]
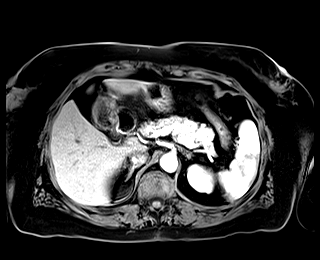
[im 72/72]
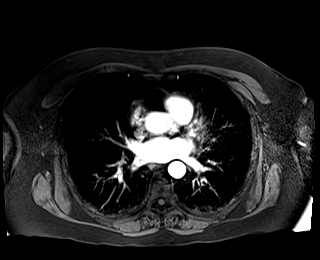

[Series 17: T1 dynamic · axial · 3.0mm · 1.19mm/px · z∈[-69,+144]mm · 3 of 72 slices shown (2 of 4)]
[im 1/72]
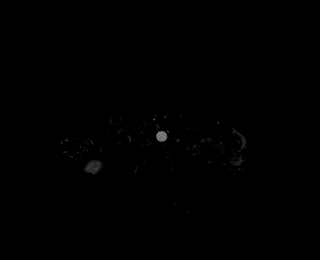
[im 36/72]
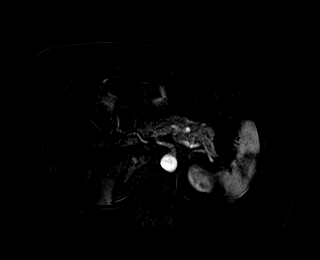
[im 72/72]
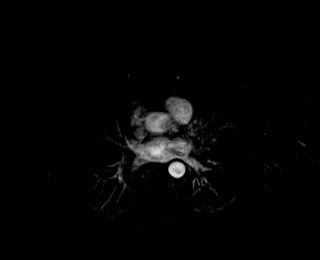

[Series 18: T1 dynamic post-contrast · axial · 3.0mm · 1.19mm/px · z∈[-69,+144]mm · 3 of 72 slices shown (2 of 6)]
[im 1/72]
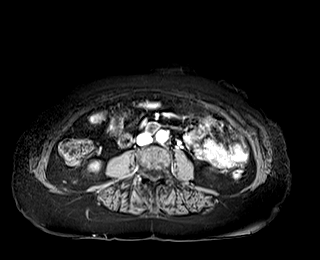
[im 36/72]
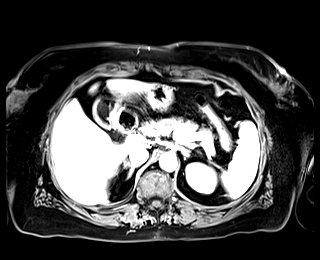
[im 72/72]
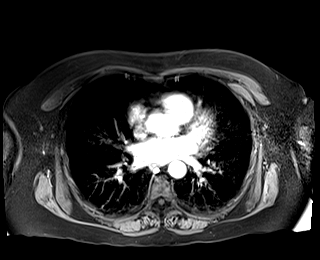

[Series 19: T1 dynamic · axial · 3.0mm · 1.19mm/px · z∈[-69,+144]mm · 3 of 72 slices shown (3 of 4)]
[im 1/72]
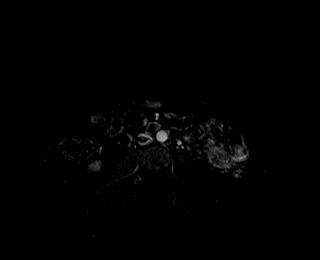
[im 36/72]
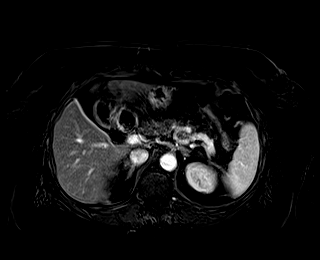
[im 72/72]
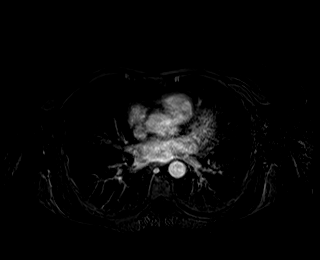

[Series 20: T1 dynamic post-contrast · axial · 3.0mm · 1.19mm/px · z∈[-69,+144]mm · 3 of 72 slices shown (3 of 6)]
[im 1/72]
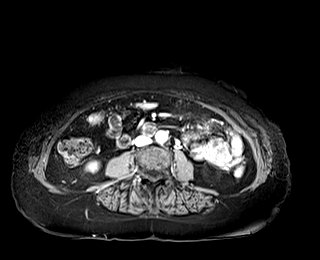
[im 36/72]
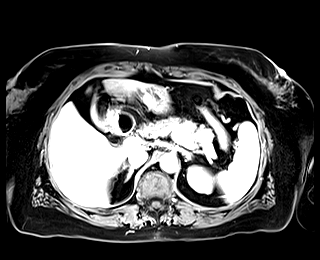
[im 72/72]
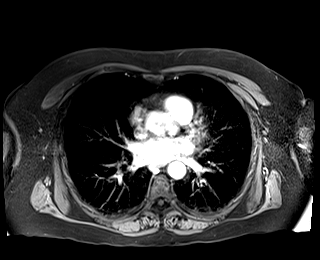

[Series 21: T1 dynamic · axial · 3.0mm · 1.19mm/px · z∈[-69,+144]mm · 3 of 72 slices shown (4 of 4)]
[im 1/72]
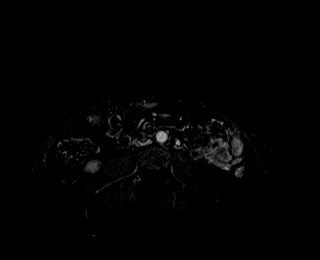
[im 36/72]
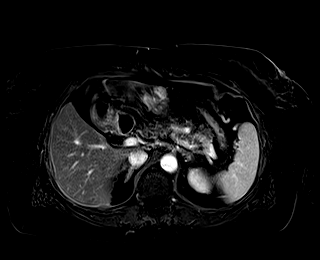
[im 72/72]
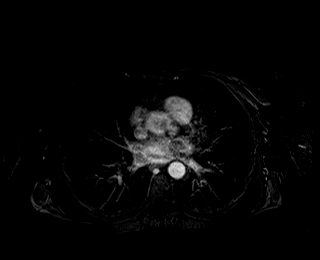

[Series 22: T1 dynamic post-contrast · coronal · 3.0mm · 1.31mm/px · 3 of 72 slices shown (4 of 6)]
[im 1/72]
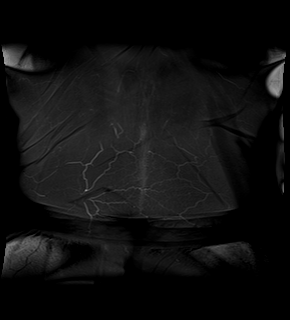
[im 36/72]
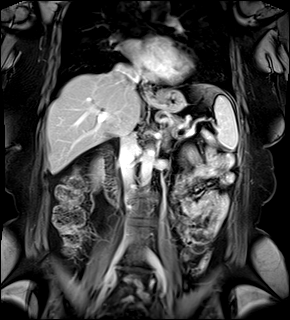
[im 72/72]
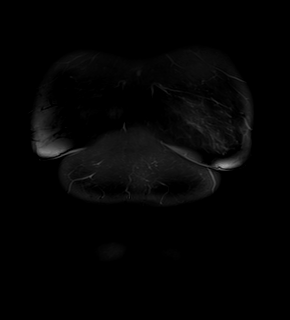

[Series 23: T1 dynamic post-contrast · axial · 3.0mm · 1.19mm/px · z∈[-69,+144]mm · 3 of 72 slices shown (5 of 6)]
[im 1/72]
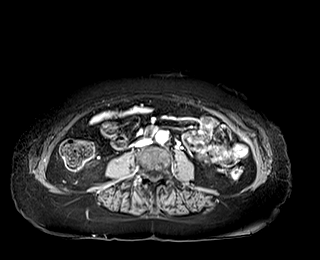
[im 36/72]
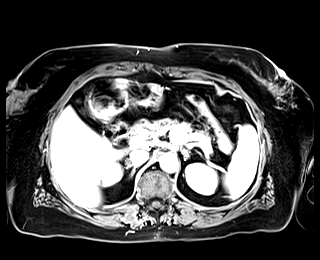
[im 72/72]
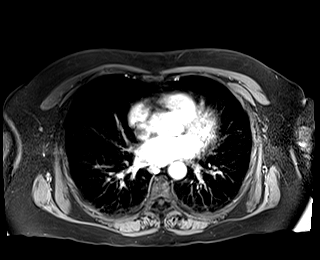

[Series 24: T1 dynamic post-contrast · axial · 3.0mm · 1.19mm/px · z∈[-69,+144]mm · 3 of 72 slices shown (6 of 6)]
[im 1/72]
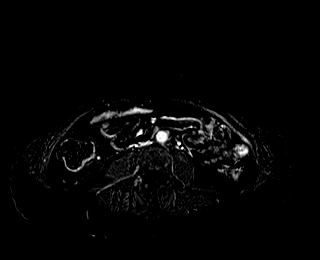
[im 36/72]
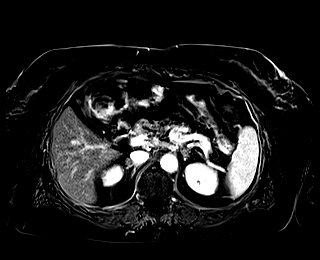
[im 72/72]
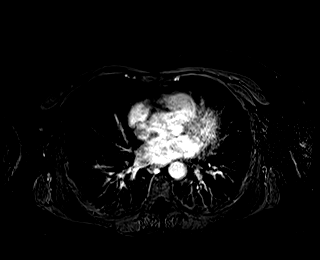

[45 of 48 positions shown; findings below may reference images not displayed]

FINDINGS: Lower chest: Normal heart size without pericardial or pleural
effusion. Tiny hiatal hernia.

Hepatobiliary: Normal liver. Cholecystectomy. No intrahepatic
biliary duct dilatation. The common duct measures up to 1.3 cm
including on 61/12 (Previously 1.6 cm at the same level on the
prior.)

No obstructive stone or mass.  Tapers in the region of the ampulla.

Pancreas: No acute pancreatitis. The previously described pancreatic
duct dilatation is improved to resolved. Example at 3 mm on 45/12
within the pancreatic head.

Tiny cystic foci within the pancreas or only readily apparent on
MRCP images. Maximally 5 mm within the neck on 53/12. A smaller
lesion within the tail on 52/12. No main duct dilatation. No
suspicious postcontrast characteristics.

Spleen:  Normal in size, without focal abnormality.

Adrenals/Urinary Tract: Normal adrenal glands. Normal kidneys,
without hydronephrosis.

Stomach/Bowel: Normal remainder of the stomach. Normal abdominal
bowel loops.

Vascular/Lymphatic: Aortic atherosclerosis. No retroperitoneal or
retrocrural adenopathy.

Other:  No ascites.

Musculoskeletal: No acute osseous abnormality.
IMPRESSION: 1. Similar size of tiny pancreatic cystic lesions, maximally 5 mm.
Per consensus criteria, these warrant follow-up with MRI/MRCP at 2
years. This recommendation follows ACR consensus guidelines:
Management of Incidental Pancreatic Cysts: A White Paper of the ACR
Incidental Findings Committee. [HOSPITAL] 5565;[DATE].
2. Improvement in common and pancreatic duct dilatation, as detailed
above. No obstructive stone or mass.
3.  Tiny hiatal hernia.
4.  Aortic Atherosclerosis ([id number].Q).

## 2022-03-22 DIAGNOSIS — I10 Essential (primary) hypertension: Secondary | ICD-10-CM | POA: Diagnosis not present

## 2022-04-04 DIAGNOSIS — H524 Presbyopia: Secondary | ICD-10-CM | POA: Diagnosis not present

## 2022-04-22 DIAGNOSIS — I10 Essential (primary) hypertension: Secondary | ICD-10-CM | POA: Diagnosis not present

## 2022-05-15 DIAGNOSIS — I700 Atherosclerosis of aorta: Secondary | ICD-10-CM | POA: Diagnosis not present

## 2022-05-15 DIAGNOSIS — Z1331 Encounter for screening for depression: Secondary | ICD-10-CM | POA: Diagnosis not present

## 2022-05-15 DIAGNOSIS — I10 Essential (primary) hypertension: Secondary | ICD-10-CM | POA: Diagnosis not present

## 2022-05-15 DIAGNOSIS — F324 Major depressive disorder, single episode, in partial remission: Secondary | ICD-10-CM | POA: Diagnosis not present

## 2022-05-15 DIAGNOSIS — F1320 Sedative, hypnotic or anxiolytic dependence, uncomplicated: Secondary | ICD-10-CM | POA: Diagnosis not present

## 2022-05-15 DIAGNOSIS — Z1339 Encounter for screening examination for other mental health and behavioral disorders: Secondary | ICD-10-CM | POA: Diagnosis not present

## 2022-05-15 DIAGNOSIS — Z299 Encounter for prophylactic measures, unspecified: Secondary | ICD-10-CM | POA: Diagnosis not present

## 2022-05-15 DIAGNOSIS — Z7189 Other specified counseling: Secondary | ICD-10-CM | POA: Diagnosis not present

## 2022-05-15 DIAGNOSIS — Z0000 Encounter for general adult medical examination without abnormal findings: Secondary | ICD-10-CM | POA: Diagnosis not present

## 2022-05-23 DIAGNOSIS — I10 Essential (primary) hypertension: Secondary | ICD-10-CM | POA: Diagnosis not present

## 2022-05-26 DIAGNOSIS — L98499 Non-pressure chronic ulcer of skin of other sites with unspecified severity: Secondary | ICD-10-CM | POA: Diagnosis not present

## 2022-05-26 DIAGNOSIS — E559 Vitamin D deficiency, unspecified: Secondary | ICD-10-CM | POA: Diagnosis not present

## 2022-05-26 DIAGNOSIS — I10 Essential (primary) hypertension: Secondary | ICD-10-CM | POA: Diagnosis not present

## 2022-05-26 DIAGNOSIS — Z299 Encounter for prophylactic measures, unspecified: Secondary | ICD-10-CM | POA: Diagnosis not present

## 2022-05-26 DIAGNOSIS — R631 Polydipsia: Secondary | ICD-10-CM | POA: Diagnosis not present

## 2022-05-26 DIAGNOSIS — R5383 Other fatigue: Secondary | ICD-10-CM | POA: Diagnosis not present

## 2022-06-02 DIAGNOSIS — Z299 Encounter for prophylactic measures, unspecified: Secondary | ICD-10-CM | POA: Diagnosis not present

## 2022-06-02 DIAGNOSIS — E559 Vitamin D deficiency, unspecified: Secondary | ICD-10-CM | POA: Diagnosis not present

## 2022-06-02 DIAGNOSIS — I10 Essential (primary) hypertension: Secondary | ICD-10-CM | POA: Diagnosis not present

## 2022-06-02 DIAGNOSIS — R739 Hyperglycemia, unspecified: Secondary | ICD-10-CM | POA: Diagnosis not present

## 2022-06-23 DIAGNOSIS — I10 Essential (primary) hypertension: Secondary | ICD-10-CM | POA: Diagnosis not present

## 2022-06-29 ENCOUNTER — Encounter: Payer: Self-pay | Admitting: Internal Medicine

## 2022-07-06 ENCOUNTER — Telehealth: Payer: Self-pay | Admitting: *Deleted

## 2022-07-06 DIAGNOSIS — K869 Disease of pancreas, unspecified: Secondary | ICD-10-CM

## 2022-07-06 NOTE — Telephone Encounter (Signed)
Pt called in. Received recall letter for her MRI/MRCP with and without contrast in 2 years for pancreatic lesions.

## 2022-07-07 NOTE — Telephone Encounter (Signed)
PA approved via [employer] for devoted for MRI/MRCP. Request #[id number], DOS: 2022-07-10 - 2022-08-24   MRI/MRCP scheduled for 7/10, arrival 11:30am, npo 4 hrs prior. Called pt, LMOVM

## 2022-07-11 NOTE — Telephone Encounter (Signed)
Patient needs ov to follow MRI. We have not seen her in over 3 years.

## 2022-07-12 NOTE — Telephone Encounter (Signed)
Pt aware of MRI appt. FU scheduled after MRI

## 2022-07-23 DIAGNOSIS — I10 Essential (primary) hypertension: Secondary | ICD-10-CM | POA: Diagnosis not present

## 2022-07-28 ENCOUNTER — Other Ambulatory Visit (HOSPITAL_COMMUNITY): Payer: Self-pay | Admitting: Gastroenterology

## 2022-07-28 DIAGNOSIS — K862 Cyst of pancreas: Secondary | ICD-10-CM

## 2022-07-28 DIAGNOSIS — K869 Disease of pancreas, unspecified: Secondary | ICD-10-CM

## 2022-08-02 ENCOUNTER — Ambulatory Visit (HOSPITAL_COMMUNITY)
Admission: RE | Admit: 2022-08-02 | Discharge: 2022-08-02 | Disposition: A | Discharge status: Home / Self Care | Payer: No Typology Code available for payment source | Source: Ambulatory Visit | Attending: Gastroenterology | Admitting: Gastroenterology

## 2022-08-02 DIAGNOSIS — N281 Cyst of kidney, acquired: Secondary | ICD-10-CM | POA: Diagnosis not present

## 2022-08-02 DIAGNOSIS — K869 Disease of pancreas, unspecified: Secondary | ICD-10-CM | POA: Insufficient documentation

## 2022-08-02 DIAGNOSIS — K8689 Other specified diseases of pancreas: Secondary | ICD-10-CM | POA: Diagnosis not present

## 2022-08-02 DIAGNOSIS — Z9049 Acquired absence of other specified parts of digestive tract: Secondary | ICD-10-CM | POA: Diagnosis not present

## 2022-08-02 MED ORDER — GADOBUTROL 1 MMOL/ML IV SOLN
7.0000 mL | Freq: Once | INTRAVENOUS | Status: AC | PRN
  Administered 2022-08-02: 7 mL via INTRAVENOUS

## 2022-08-07 ENCOUNTER — Ambulatory Visit: Payer: No Typology Code available for payment source | Admitting: Gastroenterology

## 2022-08-07 NOTE — Progress Notes (Deleted)
GI Office Note    Referring Provider: Kirstie Peri, MD Primary Care Physician:  Kirstie Peri, MD  Primary Gastroenterologist: Hennie Duos. Marletta Lor, DO   Chief Complaint   No chief complaint on file.   History of Present Illness   [name] is a 72 y.o. female presenting today for follow up of pancreatic cysts. We last saw her in 03/2019 but have been following her pancreatic cysts via MRI.    [facility] labs ***  EUS completed 06/2019 showing normal UGI trace, including good views of normal major papilla. Dilated extrahepatic bile duct likely an exaggerated physiologic response to gallbladder removal many years ago. Two very small, innocent appearing pancreatic cysts. Recommended MRI pancreas in one year and periodic LFTs.        Medications   Current Outpatient Medications  Medication Sig Dispense Refill   diazepam (VALIUM) 10 MG tablet Take 10 mg by mouth 2 (two) times daily.     escitalopram (LEXAPRO) 20 MG tablet Take 1 tablet (20 mg total) by mouth daily. 30 tablet 1   rizatriptan (MAXALT) 5 MG tablet Take 5 mg by mouth as needed for migraine. May repeat in 2 hours if needed     No current facility-administered medications for this visit.    Allergies   Allergies as of 08/07/2022   (No Known Allergies)     Past Medical History   Past Medical History:  Diagnosis Date   Anxiety    Depression    GERD (gastroesophageal reflux disease)    Low back pain    DDD    Past Surgical History   Past Surgical History:  Procedure Laterality Date   ABDOMINAL HYSTERECTOMY     CARPAL TUNNEL RELEASE     CESAREAN SECTION     CHOLECYSTECTOMY     COLONOSCOPY  2008   Dr. Darrick Penna: Diverticulosis.  Next colonoscopy 10 years   ESOPHAGOGASTRODUODENOSCOPY N/A 06/26/2019   Procedure: ESOPHAGOGASTRODUODENOSCOPY (EGD);  Surgeon: Rachael Fee, MD;  Location: [facility];  Service: Endoscopy;  Laterality: N/A;   EUS N/A 06/26/2019   Procedure: UPPER ENDOSCOPIC ULTRASOUND  (EUS) RADIAL;  Surgeon: Rachael Fee, MD;  Location: [facility];  Service: Endoscopy;  Laterality: N/A;   HERNIA REPAIR     PLANTAR FASCIA RELEASE      Past Family History   Family History  Problem Relation Age of Onset   Depression Father    Prostate cancer Father    Brain cancer Father 84       glioblastoma   Lung cancer Mother        nonsmoker   Dementia Paternal Aunt    Bone cancer Maternal Aunt    Colon cancer Neg Hx     Past Social History   Social History   Socioeconomic History   Marital status: Married    Spouse name: Not on file   Number of children: Not on file   Years of education: Not on file   Highest education level: Not on file  Occupational History   Not on file  Tobacco Use   Smoking status: Never   Smokeless tobacco: Never  Substance and Sexual Activity   Alcohol use: No   Drug use: No   Sexual activity: Not on file  Other Topics Concern   Not on file  Social History Narrative   Not on file   Social Determinants of Health   Financial Resource Strain: Not on file  Food Insecurity: Not on  file  Transportation Needs: Not on file  Physical Activity: Not on file  Stress: Not on file  Social Connections: Not on file  Intimate Partner Violence: Not on file    Review of Systems   General: Negative for anorexia, weight loss, fever, chills, fatigue, weakness. ENT: Negative for hoarseness, difficulty swallowing , nasal congestion. CV: Negative for chest pain, angina, palpitations, dyspnea on exertion, peripheral edema.  Respiratory: Negative for dyspnea at rest, dyspnea on exertion, cough, sputum, wheezing.  GI: See history of present illness. GU:  Negative for dysuria, hematuria, urinary incontinence, urinary frequency, nocturnal urination.  Endo: Negative for unusual weight change.     Physical Exam   There were no vitals taken for this visit.   General: Well-nourished, well-developed in no acute distress.  Eyes: No  icterus. Mouth: Oropharyngeal mucosa moist and pink , no lesions erythema or exudate. Lungs: Clear to auscultation bilaterally.  Heart: Regular rate and rhythm, no murmurs rubs or gallops.  Abdomen: Bowel sounds are normal, nontender, nondistended, no hepatosplenomegaly or masses,  no abdominal bruits or hernia , no rebound or guarding.  Rectal: ***  Extremities: No lower extremity edema. No clubbing or deformities. Neuro: Alert and oriented x 4   Skin: Warm and dry, no jaundice.   Psych: Alert and cooperative, normal mood and affect.  Labs   *** Imaging Studies   MR ABDOMEN MRCP W WO CONTAST  Result Date: 08/04/2022 CLINICAL DATA:  Follow-up cystic pancreatic lesions EXAM: MRI ABDOMEN WITHOUT AND WITH CONTRAST (INCLUDING MRCP) TECHNIQUE: Multiplanar multisequence MR imaging of the abdomen was performed both before and after the administration of intravenous contrast. Heavily T2-weighted images of the biliary and pancreatic ducts were obtained, and three-dimensional MRCP images were rendered by post processing. CONTRAST:  7mL GADAVIST GADOBUTROL 1 MMOL/ML IV SOLN COMPARISON:  08/03/2020, 05/23/2019 FINDINGS: Lower chest: No acute abnormality. Hepatobiliary: No focal liver abnormality is seen. Status post cholecystectomy. Unchanged postoperative biliary dilatation. Pancreas: Unchanged diffuse prominence of the common bile duct, measuring up to 0.4 cm in caliber. Occasional tiny fluid signal cystic lesions of the pancreas are unchanged, again measuring no greater than 0.5 cm (series 11, image 41). No solid mass or suspicious contrast enhancement. No pancreatic ductal dilatation or surrounding inflammatory changes. Spleen: Normal in size without significant abnormality. Adrenals/Urinary Tract: Adrenal glands are unremarkable. Simple, benign bilateral parapelvic renal cysts, requiring no further follow-up or characterization. Kidneys are otherwise normal, without obvious renal calculi, solid lesion,  or hydronephrosis. Stomach/Bowel: Stomach is within normal limits. No evidence of bowel wall thickening, distention, or inflammatory changes. Vascular/Lymphatic: No significant vascular findings are present. No enlarged abdominal lymph nodes. Other: No abdominal wall hernia or abnormality. No ascites. Musculoskeletal: No acute or significant osseous findings. IMPRESSION: 1. Occasional tiny fluid signal cystic lesions of the pancreas are unchanged, again measuring no greater than 0.5 cm. These are most consistent with tiny side branch IPMNs. As there is no observed increased risk of malignancy for such lesions smaller than 2 cm, especially given well established stability, no specific further follow-up or characterization is required. 2. Status post cholecystectomy. Unchanged postoperative biliary dilatation. Electronically Signed   By: Jearld Lesch M.D.   On: 08/04/2022 10:42   MR 3D Recon At Scanner  Result Date: 08/04/2022 CLINICAL DATA:  Follow-up cystic pancreatic lesions EXAM: MRI ABDOMEN WITHOUT AND WITH CONTRAST (INCLUDING MRCP) TECHNIQUE: Multiplanar multisequence MR imaging of the abdomen was performed both before and after the administration of intravenous contrast. Heavily T2-weighted images of the  biliary and pancreatic ducts were obtained, and three-dimensional MRCP images were rendered by post processing. CONTRAST:  7mL GADAVIST GADOBUTROL 1 MMOL/ML IV SOLN COMPARISON:  08/03/2020, 05/23/2019 FINDINGS: Lower chest: No acute abnormality. Hepatobiliary: No focal liver abnormality is seen. Status post cholecystectomy. Unchanged postoperative biliary dilatation. Pancreas: Unchanged diffuse prominence of the common bile duct, measuring up to 0.4 cm in caliber. Occasional tiny fluid signal cystic lesions of the pancreas are unchanged, again measuring no greater than 0.5 cm (series 11, image 41). No solid mass or suspicious contrast enhancement. No pancreatic ductal dilatation or surrounding inflammatory  changes. Spleen: Normal in size without significant abnormality. Adrenals/Urinary Tract: Adrenal glands are unremarkable. Simple, benign bilateral parapelvic renal cysts, requiring no further follow-up or characterization. Kidneys are otherwise normal, without obvious renal calculi, solid lesion, or hydronephrosis. Stomach/Bowel: Stomach is within normal limits. No evidence of bowel wall thickening, distention, or inflammatory changes. Vascular/Lymphatic: No significant vascular findings are present. No enlarged abdominal lymph nodes. Other: No abdominal wall hernia or abnormality. No ascites. Musculoskeletal: No acute or significant osseous findings. IMPRESSION: 1. Occasional tiny fluid signal cystic lesions of the pancreas are unchanged, again measuring no greater than 0.5 cm. These are most consistent with tiny side branch IPMNs. As there is no observed increased risk of malignancy for such lesions smaller than 2 cm, especially given well established stability, no specific further follow-up or characterization is required. 2. Status post cholecystectomy. Unchanged postoperative biliary dilatation. Electronically Signed   By: Jearld Lesch M.D.   On: 08/04/2022 10:42    Assessment       PLAN   ***   Leanna Battles. Melvyn Neth, MHS, PA-C [facility]

## 2022-08-23 DIAGNOSIS — I10 Essential (primary) hypertension: Secondary | ICD-10-CM | POA: Diagnosis not present

## 2022-08-24 DIAGNOSIS — F324 Major depressive disorder, single episode, in partial remission: Secondary | ICD-10-CM | POA: Diagnosis not present

## 2022-08-24 DIAGNOSIS — Z299 Encounter for prophylactic measures, unspecified: Secondary | ICD-10-CM | POA: Diagnosis not present

## 2022-08-24 DIAGNOSIS — K219 Gastro-esophageal reflux disease without esophagitis: Secondary | ICD-10-CM | POA: Diagnosis not present

## 2022-08-24 DIAGNOSIS — I10 Essential (primary) hypertension: Secondary | ICD-10-CM | POA: Diagnosis not present

## 2022-09-04 DIAGNOSIS — H5789 Other specified disorders of eye and adnexa: Secondary | ICD-10-CM | POA: Diagnosis not present

## 2022-09-04 DIAGNOSIS — Z299 Encounter for prophylactic measures, unspecified: Secondary | ICD-10-CM | POA: Diagnosis not present

## 2022-09-04 DIAGNOSIS — I10 Essential (primary) hypertension: Secondary | ICD-10-CM | POA: Diagnosis not present

## 2022-09-23 DIAGNOSIS — I10 Essential (primary) hypertension: Secondary | ICD-10-CM | POA: Diagnosis not present

## 2022-10-23 DIAGNOSIS — I10 Essential (primary) hypertension: Secondary | ICD-10-CM | POA: Diagnosis not present

## 2022-10-25 DIAGNOSIS — Z23 Encounter for immunization: Secondary | ICD-10-CM | POA: Diagnosis not present

## 2022-10-25 DIAGNOSIS — Z0000 Encounter for general adult medical examination without abnormal findings: Secondary | ICD-10-CM | POA: Diagnosis not present

## 2022-10-25 DIAGNOSIS — E7800 Pure hypercholesterolemia, unspecified: Secondary | ICD-10-CM | POA: Diagnosis not present

## 2022-10-25 DIAGNOSIS — K589 Irritable bowel syndrome without diarrhea: Secondary | ICD-10-CM | POA: Diagnosis not present

## 2022-10-25 DIAGNOSIS — Z299 Encounter for prophylactic measures, unspecified: Secondary | ICD-10-CM | POA: Diagnosis not present

## 2022-10-25 DIAGNOSIS — I10 Essential (primary) hypertension: Secondary | ICD-10-CM | POA: Diagnosis not present

## 2022-10-25 DIAGNOSIS — R52 Pain, unspecified: Secondary | ICD-10-CM | POA: Diagnosis not present

## 2022-10-25 DIAGNOSIS — R5383 Other fatigue: Secondary | ICD-10-CM | POA: Diagnosis not present

## 2022-10-25 DIAGNOSIS — E559 Vitamin D deficiency, unspecified: Secondary | ICD-10-CM | POA: Diagnosis not present

## 2022-11-22 DIAGNOSIS — I10 Essential (primary) hypertension: Secondary | ICD-10-CM | POA: Diagnosis not present

## 2022-11-23 DIAGNOSIS — E7800 Pure hypercholesterolemia, unspecified: Secondary | ICD-10-CM | POA: Diagnosis not present

## 2022-11-23 DIAGNOSIS — Z79899 Other long term (current) drug therapy: Secondary | ICD-10-CM | POA: Diagnosis not present

## 2022-11-23 DIAGNOSIS — R5383 Other fatigue: Secondary | ICD-10-CM | POA: Diagnosis not present

## 2022-11-23 DIAGNOSIS — E559 Vitamin D deficiency, unspecified: Secondary | ICD-10-CM | POA: Diagnosis not present

## 2022-11-27 ENCOUNTER — Other Ambulatory Visit: Payer: Self-pay | Admitting: Internal Medicine

## 2022-11-27 DIAGNOSIS — Z1231 Encounter for screening mammogram for malignant neoplasm of breast: Secondary | ICD-10-CM

## 2022-12-22 DIAGNOSIS — I10 Essential (primary) hypertension: Secondary | ICD-10-CM | POA: Diagnosis not present

## 2023-01-02 ENCOUNTER — Ambulatory Visit
Admission: RE | Admit: 2023-01-02 | Discharge: 2023-01-02 | Disposition: A | Discharge status: Home / Self Care | Payer: No Typology Code available for payment source | Source: Ambulatory Visit | Attending: Internal Medicine | Admitting: Internal Medicine

## 2023-01-02 DIAGNOSIS — Z1231 Encounter for screening mammogram for malignant neoplasm of breast: Secondary | ICD-10-CM | POA: Diagnosis not present

## 2023-01-03 DIAGNOSIS — I700 Atherosclerosis of aorta: Secondary | ICD-10-CM | POA: Diagnosis not present

## 2023-01-03 DIAGNOSIS — F324 Major depressive disorder, single episode, in partial remission: Secondary | ICD-10-CM | POA: Diagnosis not present

## 2023-01-03 DIAGNOSIS — I10 Essential (primary) hypertension: Secondary | ICD-10-CM | POA: Diagnosis not present

## 2023-01-03 DIAGNOSIS — K219 Gastro-esophageal reflux disease without esophagitis: Secondary | ICD-10-CM | POA: Diagnosis not present

## 2023-01-03 DIAGNOSIS — Z299 Encounter for prophylactic measures, unspecified: Secondary | ICD-10-CM | POA: Diagnosis not present

## 2023-01-22 DIAGNOSIS — I10 Essential (primary) hypertension: Secondary | ICD-10-CM | POA: Diagnosis not present

## 2023-02-12 DIAGNOSIS — Z299 Encounter for prophylactic measures, unspecified: Secondary | ICD-10-CM | POA: Diagnosis not present

## 2023-02-12 DIAGNOSIS — F1320 Sedative, hypnotic or anxiolytic dependence, uncomplicated: Secondary | ICD-10-CM | POA: Diagnosis not present

## 2023-02-12 DIAGNOSIS — I10 Essential (primary) hypertension: Secondary | ICD-10-CM | POA: Diagnosis not present

## 2023-02-12 DIAGNOSIS — I700 Atherosclerosis of aorta: Secondary | ICD-10-CM | POA: Diagnosis not present

## 2023-02-12 DIAGNOSIS — F4321 Adjustment disorder with depressed mood: Secondary | ICD-10-CM | POA: Diagnosis not present

## 2023-02-21 DIAGNOSIS — I10 Essential (primary) hypertension: Secondary | ICD-10-CM | POA: Diagnosis not present

## 2023-03-02 DIAGNOSIS — R011 Cardiac murmur, unspecified: Secondary | ICD-10-CM | POA: Diagnosis not present

## 2023-03-02 DIAGNOSIS — R059 Cough, unspecified: Secondary | ICD-10-CM | POA: Diagnosis not present

## 2023-03-02 DIAGNOSIS — Z299 Encounter for prophylactic measures, unspecified: Secondary | ICD-10-CM | POA: Diagnosis not present

## 2023-03-02 DIAGNOSIS — J209 Acute bronchitis, unspecified: Secondary | ICD-10-CM | POA: Diagnosis not present

## 2023-03-02 DIAGNOSIS — I10 Essential (primary) hypertension: Secondary | ICD-10-CM | POA: Diagnosis not present

## 2023-03-23 DIAGNOSIS — I10 Essential (primary) hypertension: Secondary | ICD-10-CM | POA: Diagnosis not present

## 2023-05-23 DIAGNOSIS — I10 Essential (primary) hypertension: Secondary | ICD-10-CM | POA: Diagnosis not present

## 2023-05-28 DIAGNOSIS — R52 Pain, unspecified: Secondary | ICD-10-CM | POA: Diagnosis not present

## 2023-05-28 DIAGNOSIS — F324 Major depressive disorder, single episode, in partial remission: Secondary | ICD-10-CM | POA: Diagnosis not present

## 2023-05-28 DIAGNOSIS — Z1331 Encounter for screening for depression: Secondary | ICD-10-CM | POA: Diagnosis not present

## 2023-05-28 DIAGNOSIS — F1320 Sedative, hypnotic or anxiolytic dependence, uncomplicated: Secondary | ICD-10-CM | POA: Diagnosis not present

## 2023-05-28 DIAGNOSIS — Z1339 Encounter for screening examination for other mental health and behavioral disorders: Secondary | ICD-10-CM | POA: Diagnosis not present

## 2023-05-28 DIAGNOSIS — Z7189 Other specified counseling: Secondary | ICD-10-CM | POA: Diagnosis not present

## 2023-05-28 DIAGNOSIS — Z0000 Encounter for general adult medical examination without abnormal findings: Secondary | ICD-10-CM | POA: Diagnosis not present

## 2023-05-28 DIAGNOSIS — I700 Atherosclerosis of aorta: Secondary | ICD-10-CM | POA: Diagnosis not present

## 2023-05-28 DIAGNOSIS — Z299 Encounter for prophylactic measures, unspecified: Secondary | ICD-10-CM | POA: Diagnosis not present

## 2023-05-28 DIAGNOSIS — I10 Essential (primary) hypertension: Secondary | ICD-10-CM | POA: Diagnosis not present

## 2023-06-23 DIAGNOSIS — I10 Essential (primary) hypertension: Secondary | ICD-10-CM | POA: Diagnosis not present

## 2023-06-27 NOTE — Progress Notes (Signed)
   06/27/2023  Patient ID: [name], female   DOB: 1950/06/30, 73 y.o.   MRN: [id number]  Reviewed patient regarding medication adherence from a quality report for [facility]. The patient is in the [facility] and MAC measures.   Per DrFirst fill history: Lisinopril 10 mg - last filled 05/31/23 for a 90-day supply.  Atorvastatin 10 mg - last filled 05/31/23 for a 90-day supply.    I will follow up for adherence monitoring.   Thank you for allowing pharmacy to be a part of this patient's care.    Livia Riffle, PharmD Clinical Pharmacist  [phone]

## 2023-06-29 DIAGNOSIS — R21 Rash and other nonspecific skin eruption: Secondary | ICD-10-CM | POA: Diagnosis not present

## 2023-06-29 DIAGNOSIS — Z299 Encounter for prophylactic measures, unspecified: Secondary | ICD-10-CM | POA: Diagnosis not present

## 2023-06-29 DIAGNOSIS — F324 Major depressive disorder, single episode, in partial remission: Secondary | ICD-10-CM | POA: Diagnosis not present

## 2023-06-29 DIAGNOSIS — I10 Essential (primary) hypertension: Secondary | ICD-10-CM | POA: Diagnosis not present

## 2023-07-23 DIAGNOSIS — I10 Essential (primary) hypertension: Secondary | ICD-10-CM | POA: Diagnosis not present

## 2023-08-23 DIAGNOSIS — I10 Essential (primary) hypertension: Secondary | ICD-10-CM | POA: Diagnosis not present

## 2023-08-29 DIAGNOSIS — I10 Essential (primary) hypertension: Secondary | ICD-10-CM | POA: Diagnosis not present

## 2023-08-29 DIAGNOSIS — K862 Cyst of pancreas: Secondary | ICD-10-CM | POA: Diagnosis not present

## 2023-08-29 DIAGNOSIS — Z299 Encounter for prophylactic measures, unspecified: Secondary | ICD-10-CM | POA: Diagnosis not present

## 2023-08-29 DIAGNOSIS — M545 Low back pain, unspecified: Secondary | ICD-10-CM | POA: Diagnosis not present

## 2023-08-29 DIAGNOSIS — R21 Rash and other nonspecific skin eruption: Secondary | ICD-10-CM | POA: Diagnosis not present

## 2023-08-29 DIAGNOSIS — R52 Pain, unspecified: Secondary | ICD-10-CM | POA: Diagnosis not present

## 2023-08-29 DIAGNOSIS — F4321 Adjustment disorder with depressed mood: Secondary | ICD-10-CM | POA: Diagnosis not present

## 2023-10-06 DIAGNOSIS — F419 Anxiety disorder, unspecified: Secondary | ICD-10-CM | POA: Diagnosis not present

## 2023-10-06 DIAGNOSIS — Z79899 Other long term (current) drug therapy: Secondary | ICD-10-CM | POA: Diagnosis not present

## 2023-10-06 DIAGNOSIS — L299 Pruritus, unspecified: Secondary | ICD-10-CM | POA: Diagnosis not present

## 2023-10-06 DIAGNOSIS — L739 Follicular disorder, unspecified: Secondary | ICD-10-CM | POA: Diagnosis not present

## 2023-10-06 DIAGNOSIS — Z886 Allergy status to analgesic agent status: Secondary | ICD-10-CM | POA: Diagnosis not present

## 2023-10-06 DIAGNOSIS — I10 Essential (primary) hypertension: Secondary | ICD-10-CM | POA: Diagnosis not present

## 2023-10-06 DIAGNOSIS — F32A Depression, unspecified: Secondary | ICD-10-CM | POA: Diagnosis not present

## 2023-10-06 DIAGNOSIS — R21 Rash and other nonspecific skin eruption: Secondary | ICD-10-CM | POA: Diagnosis not present

## 2023-10-09 DIAGNOSIS — I10 Essential (primary) hypertension: Secondary | ICD-10-CM | POA: Diagnosis not present

## 2023-10-09 DIAGNOSIS — Z299 Encounter for prophylactic measures, unspecified: Secondary | ICD-10-CM | POA: Diagnosis not present

## 2023-10-09 DIAGNOSIS — R21 Rash and other nonspecific skin eruption: Secondary | ICD-10-CM | POA: Diagnosis not present

## 2023-10-09 DIAGNOSIS — B029 Zoster without complications: Secondary | ICD-10-CM | POA: Diagnosis not present

## 2023-10-26 DIAGNOSIS — R5383 Other fatigue: Secondary | ICD-10-CM | POA: Diagnosis not present

## 2023-10-26 DIAGNOSIS — L509 Urticaria, unspecified: Secondary | ICD-10-CM | POA: Diagnosis not present

## 2023-10-26 DIAGNOSIS — Z23 Encounter for immunization: Secondary | ICD-10-CM | POA: Diagnosis not present

## 2023-10-26 DIAGNOSIS — E7800 Pure hypercholesterolemia, unspecified: Secondary | ICD-10-CM | POA: Diagnosis not present

## 2023-10-26 DIAGNOSIS — I10 Essential (primary) hypertension: Secondary | ICD-10-CM | POA: Diagnosis not present

## 2023-10-26 DIAGNOSIS — Z299 Encounter for prophylactic measures, unspecified: Secondary | ICD-10-CM | POA: Diagnosis not present

## 2023-10-26 DIAGNOSIS — Z79899 Other long term (current) drug therapy: Secondary | ICD-10-CM | POA: Diagnosis not present

## 2023-10-26 DIAGNOSIS — F1320 Sedative, hypnotic or anxiolytic dependence, uncomplicated: Secondary | ICD-10-CM | POA: Diagnosis not present

## 2023-10-26 DIAGNOSIS — Z0000 Encounter for general adult medical examination without abnormal findings: Secondary | ICD-10-CM | POA: Diagnosis not present
